# Patient Record
Sex: Female | Born: 1965 | Race: White | Hispanic: No | Marital: Married | Smoking: Never smoker
Health system: Southern US, Community
[De-identification: ages and names within clinical notes are randomized; demographics above are authoritative.]

## PROBLEM LIST (undated history)

## (undated) DIAGNOSIS — R42 Dizziness and giddiness: Secondary | ICD-10-CM

## (undated) DIAGNOSIS — Z9889 Other specified postprocedural states: Secondary | ICD-10-CM

## (undated) DIAGNOSIS — I8393 Asymptomatic varicose veins of bilateral lower extremities: Secondary | ICD-10-CM

## (undated) DIAGNOSIS — K5900 Constipation, unspecified: Secondary | ICD-10-CM

## (undated) DIAGNOSIS — R112 Nausea with vomiting, unspecified: Secondary | ICD-10-CM

## (undated) HISTORY — PX: VARICOSE VEIN SURGERY: SHX832

## (undated) HISTORY — PX: WISDOM TOOTH EXTRACTION: SHX21

## (undated) HISTORY — PX: COLONOSCOPY: SHX174

---

## 2009-10-22 ENCOUNTER — Ambulatory Visit: Payer: Self-pay | Admitting: Diagnostic Radiology

## 2009-10-22 ENCOUNTER — Emergency Department (HOSPITAL_BASED_OUTPATIENT_CLINIC_OR_DEPARTMENT_OTHER): Admission: EM | Admit: 2009-10-22 | Discharge: 2009-10-22 | Payer: Self-pay | Admitting: Emergency Medicine

## 2010-04-05 LAB — COMPREHENSIVE METABOLIC PANEL
BUN: 9 mg/dL (ref 6–23)
Calcium: 9.2 mg/dL (ref 8.4–10.5)
Glucose, Bld: 87 mg/dL (ref 70–99)
Total Protein: 6.8 g/dL (ref 6.0–8.3)

## 2010-04-05 LAB — CBC
HCT: 37 % (ref 36.0–46.0)
MCHC: 35.3 g/dL (ref 30.0–36.0)
MCV: 90.4 fL (ref 78.0–100.0)
RDW: 12.7 % (ref 11.5–15.5)

## 2010-04-05 LAB — URINALYSIS, ROUTINE W REFLEX MICROSCOPIC
Glucose, UA: NEGATIVE mg/dL
Hgb urine dipstick: NEGATIVE
Ketones, ur: NEGATIVE mg/dL
Protein, ur: NEGATIVE mg/dL

## 2010-04-05 LAB — DIFFERENTIAL
Basophils Relative: 1 % (ref 0–1)
Lymphs Abs: 1.2 10*3/uL (ref 0.7–4.0)
Monocytes Relative: 9 % (ref 3–12)
Neutro Abs: 4.2 10*3/uL (ref 1.7–7.7)
Neutrophils Relative %: 68 % (ref 43–77)

## 2010-04-05 LAB — GC/CHLAMYDIA PROBE AMP, GENITAL
Chlamydia, DNA Probe: NEGATIVE
GC Probe Amp, Genital: NEGATIVE

## 2010-04-05 LAB — WET PREP, GENITAL
Clue Cells Wet Prep HPF POC: NONE SEEN
Trich, Wet Prep: NONE SEEN

## 2014-11-08 DIAGNOSIS — M545 Other chronic pain: Secondary | ICD-10-CM | POA: Insufficient documentation

## 2014-11-08 DIAGNOSIS — G8929 Other chronic pain: Secondary | ICD-10-CM | POA: Insufficient documentation

## 2014-11-08 DIAGNOSIS — E042 Nontoxic multinodular goiter: Secondary | ICD-10-CM | POA: Insufficient documentation

## 2014-11-08 DIAGNOSIS — N959 Unspecified menopausal and perimenopausal disorder: Secondary | ICD-10-CM | POA: Insufficient documentation

## 2014-12-27 ENCOUNTER — Other Ambulatory Visit (HOSPITAL_COMMUNITY): Payer: Self-pay | Admitting: Surgery

## 2014-12-27 DIAGNOSIS — E042 Nontoxic multinodular goiter: Secondary | ICD-10-CM

## 2015-01-02 ENCOUNTER — Ambulatory Visit (HOSPITAL_COMMUNITY): Admission: RE | Admit: 2015-01-02 | Payer: BLUE CROSS/BLUE SHIELD | Source: Ambulatory Visit

## 2015-01-30 ENCOUNTER — Other Ambulatory Visit: Payer: Self-pay | Admitting: Radiology

## 2015-01-31 ENCOUNTER — Other Ambulatory Visit (HOSPITAL_COMMUNITY): Payer: Self-pay | Admitting: Surgery

## 2015-01-31 ENCOUNTER — Ambulatory Visit (HOSPITAL_COMMUNITY)
Admission: RE | Admit: 2015-01-31 | Discharge: 2015-01-31 | Disposition: A | Discharge status: Home / Self Care | Payer: BLUE CROSS/BLUE SHIELD | Source: Ambulatory Visit | Attending: Surgery | Admitting: Surgery

## 2015-01-31 DIAGNOSIS — E042 Nontoxic multinodular goiter: Secondary | ICD-10-CM

## 2015-01-31 MED ORDER — LIDOCAINE HCL (PF) 1 % IJ SOLN
INTRAMUSCULAR | Status: AC
  Filled 2015-01-31: qty 10

## 2015-01-31 NOTE — Procedures (Signed)
   US guided left thyroid nodule biopsy And US guided isthmus thyroid nodule biopsy  4  25g each  Pt tolerated well

## 2015-03-20 DIAGNOSIS — M5030 Other cervical disc degeneration, unspecified cervical region: Secondary | ICD-10-CM | POA: Insufficient documentation

## 2015-03-20 DIAGNOSIS — K6389 Other specified diseases of intestine: Secondary | ICD-10-CM | POA: Insufficient documentation

## 2015-04-18 DIAGNOSIS — G8929 Other chronic pain: Secondary | ICD-10-CM | POA: Insufficient documentation

## 2015-05-24 ENCOUNTER — Other Ambulatory Visit: Payer: Self-pay | Admitting: Surgery

## 2015-05-24 DIAGNOSIS — E042 Nontoxic multinodular goiter: Secondary | ICD-10-CM

## 2015-05-30 DIAGNOSIS — Z1231 Encounter for screening mammogram for malignant neoplasm of breast: Secondary | ICD-10-CM | POA: Diagnosis not present

## 2015-05-30 DIAGNOSIS — Z1239 Encounter for other screening for malignant neoplasm of breast: Secondary | ICD-10-CM | POA: Diagnosis not present

## 2015-07-10 ENCOUNTER — Ambulatory Visit
Admission: RE | Admit: 2015-07-10 | Discharge: 2015-07-10 | Disposition: A | Discharge status: Home / Self Care | Payer: BLUE CROSS/BLUE SHIELD | Source: Ambulatory Visit | Attending: Surgery | Admitting: Surgery

## 2015-07-10 DIAGNOSIS — E042 Nontoxic multinodular goiter: Secondary | ICD-10-CM | POA: Diagnosis not present

## 2015-07-31 DIAGNOSIS — L237 Allergic contact dermatitis due to plants, except food: Secondary | ICD-10-CM | POA: Diagnosis not present

## 2015-07-31 DIAGNOSIS — Z6821 Body mass index (BMI) 21.0-21.9, adult: Secondary | ICD-10-CM | POA: Diagnosis not present

## 2015-09-28 DIAGNOSIS — I83813 Varicose veins of bilateral lower extremities with pain: Secondary | ICD-10-CM | POA: Diagnosis not present

## 2015-09-28 DIAGNOSIS — I83893 Varicose veins of bilateral lower extremities with other complications: Secondary | ICD-10-CM | POA: Diagnosis not present

## 2015-09-29 DIAGNOSIS — M79605 Pain in left leg: Secondary | ICD-10-CM | POA: Diagnosis not present

## 2015-09-29 DIAGNOSIS — I83893 Varicose veins of bilateral lower extremities with other complications: Secondary | ICD-10-CM | POA: Diagnosis not present

## 2015-09-29 DIAGNOSIS — M79604 Pain in right leg: Secondary | ICD-10-CM | POA: Diagnosis not present

## 2015-10-03 DIAGNOSIS — M79642 Pain in left hand: Secondary | ICD-10-CM | POA: Diagnosis not present

## 2015-10-03 DIAGNOSIS — G5603 Carpal tunnel syndrome, bilateral upper limbs: Secondary | ICD-10-CM | POA: Diagnosis not present

## 2015-10-03 DIAGNOSIS — M25342 Other instability, left hand: Secondary | ICD-10-CM | POA: Diagnosis not present

## 2015-10-03 DIAGNOSIS — M79641 Pain in right hand: Secondary | ICD-10-CM | POA: Diagnosis not present

## 2015-10-04 DIAGNOSIS — M25342 Other instability, left hand: Secondary | ICD-10-CM | POA: Insufficient documentation

## 2015-10-04 DIAGNOSIS — G5603 Carpal tunnel syndrome, bilateral upper limbs: Secondary | ICD-10-CM | POA: Insufficient documentation

## 2015-10-09 DIAGNOSIS — M79605 Pain in left leg: Secondary | ICD-10-CM | POA: Diagnosis not present

## 2015-10-09 DIAGNOSIS — I83893 Varicose veins of bilateral lower extremities with other complications: Secondary | ICD-10-CM | POA: Diagnosis not present

## 2015-10-09 DIAGNOSIS — M79604 Pain in right leg: Secondary | ICD-10-CM | POA: Diagnosis not present

## 2015-12-18 DIAGNOSIS — I83819 Varicose veins of unspecified lower extremities with pain: Secondary | ICD-10-CM | POA: Diagnosis not present

## 2016-03-26 DIAGNOSIS — Z1151 Encounter for screening for human papillomavirus (HPV): Secondary | ICD-10-CM | POA: Diagnosis not present

## 2016-03-26 DIAGNOSIS — Z0000 Encounter for general adult medical examination without abnormal findings: Secondary | ICD-10-CM | POA: Diagnosis not present

## 2016-03-26 DIAGNOSIS — E049 Nontoxic goiter, unspecified: Secondary | ICD-10-CM | POA: Diagnosis not present

## 2016-06-13 ENCOUNTER — Telehealth: Payer: Self-pay | Admitting: General Practice

## 2016-06-13 NOTE — Telephone Encounter (Signed)
Patient called in stating she did not receive her new patient paperwork and was requesting it be re sent.

## 2016-06-13 NOTE — Telephone Encounter (Signed)
Paperwork mailed

## 2016-06-20 ENCOUNTER — Encounter: Payer: Self-pay | Admitting: Internal Medicine

## 2016-06-20 ENCOUNTER — Ambulatory Visit (INDEPENDENT_AMBULATORY_CARE_PROVIDER_SITE_OTHER): Payer: BLUE CROSS/BLUE SHIELD | Admitting: Internal Medicine

## 2016-06-20 VITALS — BP 110/60 | HR 71 | Ht 66.0 in | Wt 131.0 lb

## 2016-06-20 DIAGNOSIS — E042 Nontoxic multinodular goiter: Secondary | ICD-10-CM | POA: Diagnosis not present

## 2016-06-20 NOTE — Progress Notes (Signed)
Patient ID: Jeanne Roberts, female   DOB: 01-30-1965, 51 y.o.   MRN: [id number]   HPI  Jeanne Roberts is a 51 y.o.-year-old female, referred by Dr Harlow Asa for multiple thyroid nodules. She previously saw several endocrinologists, last: Dr. Meredith Pel. I reviewed her notes.  PCP: Dr Theodis Sato  She was dx'ed with thyroid nodules in 2005 - after a nodule was found by palpation.  Thyroid nodule Bx (05/24/2003):  - Left: benign - Right: benign  Thyroid U/S (11/17/2014): Right thyroid lobe: 64 x 30 x 31 mm. 46 x 27 x 37 mm complex mostly solid mass, inferior pole.  Left thyroid lobe: 65 x 38 x 39 mm. Somewhat lobular 53 x 26 x 42 mm solid hyperemic mass, inferior pole.  Isthmus Thickness: 6 mm.24 x 17 x 20 mm complex cyst, right isthmus  Lymphadenopathy: None visualized.  Bilateral calcified carotid plaque is incidentally noted, without assessment for degree of stenosis.  IMPRESSION: 1. Thyromegaly with right, left, and isthmic nodules which all meet consensus criteria for biopsy.  Thyroid nodule Bx (01/31/2015): - isthmus nodule: scant follicular epithelium: bethesda categ 1 - L thyroid nodule: benign  Thyroid U/S (07/10/2015): Right thyroid lobe: 7.2 x 3.2 x 3.6 cm. Heterogeneous lower pole nodule measured 2.8 x 4.9 x 2.5 cm and previously measured 4.6 x 2.7 x 3.7 cm. Subcentimeter right upper pole nodules are noted.  Left thyroid lobe: 7.3 x 3.4 x 4.5 cm. Solid heterogeneous lower pole nodule measures 4.4 x 5.2 x 2.9 cm and previously measured up to 5.3 cm. Subcentimeter left upper pole nodules are noted.  Isthmus Thickness: 0.8 cm. Hypoechoic nodule measures 1.9 x 1.4 x 2.1 cm and previously measured up to 2.4 cm.  Lymphadenopathy: None visualized.  IMPRESSION: Bilateral and isthmic nodules are not significantly changed as described above. The largest is on the left and measures up to 5.2 cm.  Pt denies: - feeling nodules in neck, but she can easily see them in the mirror -  hoarseness - dysphagia - choking - SOB with lying down  I reviewed pt's thyroid tests:  03/26/2016: TSH 1.62, free T4 1.19, free T3 3.53  03/20/2015: TSH 0.84, free T4 0.83, free T3 2.9 No results found for: TSH, FREET4   Pt denies: - fatigue - tremors - palpitations - anxiety/depression - hyperdefecation/constipation - weight loss/weight gain - dry skin - hair loss  But she has: - hot flushes - nocturia  + FH of thyroid ds in mother, who had to have her thyroid removed (not cancer). also, her 2 sisters also have thyroid problems. One of them was have thyroid surgery in [place]. Per my understanding, she also had an indeterminant nodule. No FH of thyroid cancer. No h/o radiation tx to head or neck.  No seaweed or kelp. No recent contrast studies. No steroid use. No herbal supplements. No Biotin supplements or Hair, Skin and Nails vitamins.   ROS: Constitutional: + see HPI Eyes: no blurry vision, no xerophthalmia ENT: no sore throat, + see HPI Cardiovascular: no CP/SOB/palpitations/leg swelling Respiratory: no cough/SOB Gastrointestinal: no N/V/D/C Musculoskeletal: no muscle/joint aches Skin: no rashes, + easy bruising Neurological: no tremors/numbness/tingling/dizziness Psychiatric: no depression/anxiety  No past medical history on file. No past surgical history on file. Social History   Social History  . Marital status: Married    Spouse name: N/A  . Number of children: 0   Occupational History  . n/a   Social History Main Topics  . Smoking status: Never Smoker  . Smokeless tobacco:  Never Used  . Alcohol use occasional  . Drug use: no   Current Outpatient Prescriptions  Medication Sig Dispense Refill  . ibuprofen (ADVIL,MOTRIN) 200 MG tablet Take 200 mg by mouth every 6 (six) hours as needed for headache, mild pain or moderate pain.    Marland Kitchen OVER THE COUNTER MEDICATION phytoestrogen    . OVER THE COUNTER MEDICATION Bone nutrient    Also,  probiotic  No Known Allergies   Family History  Problem Relation Age of Onset  . Hypertension Mother   . Thyroid disease Mother   . Diabetes Father   . Thyroid disease Sister   . Hypertension Maternal Uncle   . Heart disease Maternal Uncle      PE: BP 110/60 (BP Location: Left Arm, Patient Position: Sitting)   Pulse 71   Ht 5\' 6"  (1.676 m)   Wt 131 lb (59.4 kg)   SpO2 98%   BMI 21.14 kg/m  Wt Readings from Last 3 Encounters:  06/20/16 131 lb (59.4 kg)   Constitutional: normal weight, fit, in NAD Eyes: PERRLA, EOMI, no exophthalmos ENT: moist mucous membranes, + large L thyromegaly, no cervical lymphadenopathy except L upper submandibular reg. Cardiovascular: RRR, No MRG Respiratory: CTA B Gastrointestinal: abdomen soft, NT, ND, BS+ Musculoskeletal: no deformities, strength intact in all 4;  Skin: moist, warm, no rashes Neurological: no tremor with outstretched hands, DTR normal in all 4  ASSESSMENT: 1. Nontoxic multinodular goiter  PLAN: 1.Nontoxic multinodular goiter - Patient has a long history of a benign, nontoxic, large goiter, with 3 dominant nodules, one in each lobe and one in the isthmus. The lobar nodules appear hyperechoic and with increased vascularity; the left nodule has increased in size in the last few years however, this has been biopsied twice and both biopsies were benign. The right nodule was only biopsied 01/27/2003, however This appears smaller on the most recent ultrasound, so not worrisome. She also has an isthmic nodule is hypoechoic and contains an increased amount of fluid. This was drained in 01/2015, and at that time the biopsy result was inconclusive due to insufficient sample. - Patient is aware of the nodules, but is not bothered by them -  she has no neck compression symptoms. She also does not have a thyroid cancer family history or a personal history of RxTx to head/neck. All these would favor benignity.  - At this visit, we discussed about  further plan. We reviewed together the images of her latest thyroid ultrasound and I reviewed the characteristics of the nodules. The left and right nodules are not worrisome, although they are large, however, the isthmic nodule appears hypoechoic and containing mostly solid material. We did discuss that we most likely need to repeat the biopsy of this nodule, however, we decided to wait until next year to obtain another thyroid ultrasound to follow on the other nodules also.  * If the left and right nodules appear to have increased in size, it is probably best to undergo thyroidectomy.  * If the nodules are stable in size, will proceed with a biopsy of the isthmic nodule and decide for or against thyroidectomy depending on the results. We will probably need to perform Afirma molecular marker test at that time.  - I advised the patient to call me approximately 1 month prior to her next appointment so I can order her next thyroid ultrasound so she and I can review the images when she comes back. She agrees with the plan.   -  we reviewed together her recent TFTs and they were normal   [name], MD PhD [facility]

## 2016-06-20 NOTE — Patient Instructions (Signed)
Please call me 1 mo before the next appt to order another ultrasound.  Please return in 1 year.

## 2016-10-18 ENCOUNTER — Other Ambulatory Visit: Payer: Self-pay | Admitting: Internal Medicine

## 2016-10-18 ENCOUNTER — Telehealth: Payer: Self-pay | Admitting: Internal Medicine

## 2016-10-18 DIAGNOSIS — E042 Nontoxic multinodular goiter: Secondary | ICD-10-CM

## 2016-10-18 NOTE — Telephone Encounter (Signed)
Done

## 2016-10-18 NOTE — Telephone Encounter (Signed)
Ordered the U/S.

## 2016-10-18 NOTE — Telephone Encounter (Signed)
[facility] called in reference to [place] needing to be changed to ultrasound. thyroid. IMG [id number].

## 2016-10-18 NOTE — Telephone Encounter (Signed)
Called patient to advise that [place] was ordered.

## 2016-10-18 NOTE — Telephone Encounter (Signed)
Please advise. Thank you

## 2016-10-18 NOTE — Telephone Encounter (Signed)
Routing to you °

## 2016-10-18 NOTE — Telephone Encounter (Signed)
Patient would like to get a referral/order for breathing problems with the goiter and would like an ultrasound. Patient states that the goiter has gotten bigger. Call patient to advise as soon as possible.

## 2016-11-08 ENCOUNTER — Ambulatory Visit
Admission: RE | Admit: 2016-11-08 | Discharge: 2016-11-08 | Disposition: A | Discharge status: Home / Self Care | Payer: BLUE CROSS/BLUE SHIELD | Source: Ambulatory Visit | Attending: Internal Medicine | Admitting: Internal Medicine

## 2016-11-08 DIAGNOSIS — E042 Nontoxic multinodular goiter: Secondary | ICD-10-CM

## 2016-11-13 ENCOUNTER — Telehealth: Payer: Self-pay | Admitting: Internal Medicine

## 2016-11-13 ENCOUNTER — Telehealth: Payer: Self-pay

## 2016-11-13 ENCOUNTER — Other Ambulatory Visit: Payer: Self-pay | Admitting: Internal Medicine

## 2016-11-13 DIAGNOSIS — E042 Nontoxic multinodular goiter: Secondary | ICD-10-CM

## 2016-11-13 NOTE — Telephone Encounter (Signed)
Called and LVM advising patient to call back regarding her [place] results. Left call back number.

## 2016-11-13 NOTE — Telephone Encounter (Signed)
Patient returned call. Please call patient

## 2016-11-13 NOTE — Telephone Encounter (Signed)
Noted, thanks!

## 2016-11-13 NOTE — Telephone Encounter (Signed)
Patient called back, states that you called her last night regarding her ultrasound.  Thank you!

## 2016-11-13 NOTE — Telephone Encounter (Signed)
Yes, I tried again today. Will call ack when I finish with pts this am

## 2016-11-13 NOTE — Telephone Encounter (Signed)
Called and spoke to patient.

## 2016-11-13 NOTE — Telephone Encounter (Signed)
-----   Message from [name], MD sent at 11/13/2016 12:19 PM EDT ----- Almyra Free, I tried to call her 3x so far and cannot get in touch with her:  Can you please let her know that her left thyroid nodule is slightly larger, with the largest dimension increasing from 5.2 cm to 5.7 cm. This nodule was biopsied twice, including last year, and is benign. However, per my understanding, she has more neck compression symptoms and I am curious whether she is interested in thyroidectomy.  The other 2 nodules are slightly smaller, which is very good news: The isthmic nodule has been previously biopsied but the biopsy was inconclusive. If she does not desire thyroidectomy, we will need to rebiopsy this nodule and most likely also the right thyroid nodule, since this is still large, although, again, decreased in size slightly since last check.

## 2016-11-13 NOTE — Telephone Encounter (Signed)
Patient received message from Dr. Renne Crigler but the message was cut off . Please call patient on cell# [phone]

## 2016-11-14 ENCOUNTER — Telehealth: Payer: Self-pay

## 2016-11-14 ENCOUNTER — Telehealth: Payer: Self-pay | Admitting: Internal Medicine

## 2016-11-14 NOTE — Telephone Encounter (Signed)
Patient needs to talk with you before she has her biopsy. Please call her she has questions

## 2016-11-14 NOTE — Telephone Encounter (Signed)
error 

## 2016-11-14 NOTE — Telephone Encounter (Signed)
Pt would like to know if it is an urgency in getting the biopsy? She would like to wait to January to start over with her deductible if that is ok since it has shrunken. Please advise.

## 2016-11-14 NOTE — Telephone Encounter (Signed)
Called pt, LVM for her to call back to discuss her questions and concerns.

## 2016-11-14 NOTE — Telephone Encounter (Signed)
Pt has been informed and expressed understanding.  

## 2016-11-14 NOTE — Telephone Encounter (Signed)
Yes, no pb >> please tell her to let [place] know when they call.

## 2016-11-14 NOTE — Telephone Encounter (Signed)
Patient returning nurses call is requesting a call back on her cell [phone]

## 2016-11-15 DIAGNOSIS — Z1231 Encounter for screening mammogram for malignant neoplasm of breast: Secondary | ICD-10-CM | POA: Diagnosis not present

## 2017-04-09 DIAGNOSIS — L237 Allergic contact dermatitis due to plants, except food: Secondary | ICD-10-CM | POA: Diagnosis not present

## 2017-05-20 ENCOUNTER — Encounter: Payer: Self-pay | Admitting: Internal Medicine

## 2017-05-20 ENCOUNTER — Ambulatory Visit (INDEPENDENT_AMBULATORY_CARE_PROVIDER_SITE_OTHER): Payer: BLUE CROSS/BLUE SHIELD | Admitting: Internal Medicine

## 2017-05-20 VITALS — BP 124/74 | HR 75 | Ht 66.0 in | Wt 136.8 lb

## 2017-05-20 DIAGNOSIS — L659 Nonscarring hair loss, unspecified: Secondary | ICD-10-CM

## 2017-05-20 DIAGNOSIS — E042 Nontoxic multinodular goiter: Secondary | ICD-10-CM

## 2017-05-20 LAB — T3, FREE: T3 FREE: 2.9 pg/mL (ref 2.3–4.2)

## 2017-05-20 LAB — TSH: TSH: 0.85 u[IU]/mL (ref 0.35–4.50)

## 2017-05-20 LAB — T4, FREE: Free T4: 0.8 ng/dL (ref 0.60–1.60)

## 2017-05-20 NOTE — Patient Instructions (Signed)
Please stop at the lab.  Please come back for a follow-up appointment in 1 year.  

## 2017-05-20 NOTE — Progress Notes (Signed)
Patient ID: Jeanne Roberts, female   DOB: 1965-11-04, 52 y.o.   MRN: [id number]   HPI  Jeanne Roberts is a 52 y.o.-year-old female, referred by Dr Harlow Asa for multiple thyroid nodules. She previously saw several endocrinologists, last: Dr. Meredith Pel.  Last visit with me almost a year ago. PCP: Dr Theodis Sato  Reviewed and addended history:  She was dx'ed with thyroid nodules in 2005, after a thyroid nodule was found by palpation.  Thyroid nodule Bx (05/24/2003):  - Left: benign - Right: benign  Thyroid U/S (11/17/2014): Right thyroid lobe: 64 x 30 x 31 mm. 46 x 27 x 37 mm complex mostly solid mass, inferior pole.  Left thyroid lobe: 65 x 38 x 39 mm. Somewhat lobular 53 x 26 x 42 mm solid hyperemic mass, inferior pole.  Isthmus Thickness: 6 mm.24 x 17 x 20 mm complex cyst, right isthmus  Lymphadenopathy: None visualized.  Bilateral calcified carotid plaque is incidentally noted, without assessment for degree of stenosis.  IMPRESSION: 1. Thyromegaly with right, left, and isthmic nodules which all meet consensus criteria for biopsy.  Thyroid nodule Bx (01/31/2015): - isthmus nodule: scant follicular epithelium: bethesda categ 1 - L thyroid nodule: benign  Thyroid U/S (07/10/2015): Right thyroid lobe: 7.2 x 3.2 x 3.6 cm. Heterogeneous lower pole nodule measured 2.8 x 4.9 x 2.5 cm and previously measured 4.6 x 2.7 x 3.7 cm. Subcentimeter right upper pole nodules are noted.  Left thyroid lobe: 7.3 x 3.4 x 4.5 cm. Solid heterogeneous lower pole nodule measures 4.4 x 5.2 x 2.9 cm and previously measured up to 5.3 cm. Subcentimeter left upper pole nodules are noted.  Isthmus Thickness: 0.8 cm. Hypoechoic nodule measures 1.9 x 1.4 x 2.1 cm and previously measured up to 2.4 cm.  Lymphadenopathy: None visualized.  IMPRESSION: Bilateral and isthmic nodules are not significantly changed as described above. The largest is on the left and measures up to 5.2 cm.  Thyroid U/S  (11/08/2016):  Parenchymal Echotexture: Moderately heterogenous  Right lobe: 7 x 2.7 x 3.3 cm, previously 7.2 x 3.2 x 3.6.  Right inferior nodule: 4.2 x 2 x 3.1 cm, previously 4.9 x 2.5 x 2.8 cm, mixed cystic and solid, hypoechoic.  Not previously biopsied   Left lobe: 7 x 3.1 x 4 cm, previously 7.3 x 3.4 x 4.5.  Lobular mid left nodule: 5.7 x 2.5 x 4.8 cm, previously 5.2 x 2.9 x 4.4; this was previously biopsied.   Isthmus: 0.9 cm thickness, previously 0.8 cm.  Very hypoechoic isthmic nodule: 1.5 x 0.8 x 1 cm, previously 2.1 x1.4 x 1.9; this was previously biopsied but with scant material.  IMPRESSION: 1. Thyromegaly with little change in bilateral and isthmic nodules. 2. Recommend FNA biopsy of mildly suspicious 4.2 cm inferior right nodule.  Plan was:  Notes recorded by [name], MD on 11/13/2016 at 12:19 PM EDT .Marland KitchenMarland KitchenHer left thyroid nodule is slightly larger, with the largest dimension increasing from 5.2 cm to 5.7 cm. This nodule was biopsied twice, including last year, and is benign. However, per my understanding, she has more neck compression symptoms and I am curious whether she is interested in thyroidectomy.  The other 2 nodules are slightly smaller, which is very good news: The isthmic nodule has been previously biopsied but the biopsy was inconclusive. If she does not desire thyroidectomy, we will need to rebiopsy this nodule and most likely also the right thyroid nodule, since this is still large, although, again, decreased in size slightly since last check.  Notes recorded by Caprice Beaver, LPN on 40/98/1191 at 12:52 PM EDT Called patient and advised of note, she stated that is is breathing better now she believes it could have been coming from anxiety, but she would like to re biopsy, instead of doing the thyroidectomy. And if she needs to have it done, she would like to have it done before the end of the year, I advised we may not need them that close together.   I  ordered the biopsy of the left thyroid nodule and the isthmic nodule after the results above returned, but she did not have this done yet.   Pt denies: - feeling nodules in neck - hoarseness - dysphagia - choking - SOB with lying down, but has snoring  Reviewed patient's most recent TFTs: 03/26/2016: TSH 1.62, free T4 1.19, free T3 3.53  03/20/2015: TSH 0.84, free T4 0.83, free T3 2.9 No results found for: TSH, FREET4   Pt denies: Fatigue Tremors Palpitations Anxiety/depression Hyper defecation/constipation Weight loss/weight gain  She continues to have: + Hot flashes -chronic + Nocturia -chronic + hair loss - tried Biotin, now on collagen  + FH of thyroid ds in mother, who had to have her thyroid removed (not cancer). also, her 2 sisters also have thyroid problems. One of them was have thyroid surgery in [place]. She also had an indeterminant nodule. She actually ended up having thyroid CA in another, smaller, nodule.  No FH of thyroid cancer. No h/o radiation tx to head or neck.  No seaweed or kelp. No recent contrast studies. No herbal supplements. No Biotin use. No recent steroids use.   ROS: Constitutional: + See HPI Eyes: no blurry vision, no xerophthalmia ENT: no sore throat, + see HPI Cardiovascular: no CP/no SOB/no palpitations/no leg swelling Respiratory: no cough/no SOB/no wheezing Gastrointestinal: no N/no V/no D/no C/no acid reflux Musculoskeletal: no muscle aches/no joint aches Skin: no rashes, + hair loss Neurological: no tremors/no numbness/no tingling/no dizziness  I reviewed pt's medications, allergies, PMH, social hx, family hx, and changes were documented in the history of present illness. Otherwise, unchanged from my initial visit note.  PMH: Patient Active Problem List   Diagnosis Date Noted  . Nontoxic multinodular goiter 06/20/2016    Priority: High   No past surgical history on file.   Social History   Social History  . Marital  status: Married    Spouse name: N/A  . Number of children: 0   Occupational History  . n/a   Social History Main Topics  . Smoking status: Never Smoker  . Smokeless tobacco: Never Used  . Alcohol use occasional  . Drug use: no   Current Outpatient Medications  Medication Sig Dispense Refill  . ibuprofen (ADVIL,MOTRIN) 200 MG tablet Take 200 mg by mouth every 6 (six) hours as needed for headache, mild pain or moderate pain.    Marland Kitchen OVER THE COUNTER MEDICATION     . OVER THE COUNTER MEDICATION      No current facility-administered medications for this visit.    No Known Allergies   Family History  Problem Relation Age of Onset  . Hypertension Mother   . Thyroid disease Mother   . Diabetes Father   . Thyroid disease Sister   . Hypertension Maternal Uncle   . Heart disease Maternal Uncle      PE: BP 124/74   Pulse 75   Ht 5\' 6"  (1.676 m)   Wt 136 lb 12.8 oz (62.1 kg)  SpO2 99%   BMI 22.08 kg/m  Wt Readings from Last 3 Encounters:  05/20/17 136 lb 12.8 oz (62.1 kg)  06/20/16 131 lb (59.4 kg)   Constitutional: normal weight, in NAD Eyes: PERRLA, EOMI, no exophthalmos ENT: moist mucous membranes, + large L thyromegaly, no cervical lymphadenopathy Cardiovascular: RRR, No MRG Respiratory: CTA B Gastrointestinal: abdomen soft, NT, ND, BS+ Musculoskeletal: no deformities, strength intact in all 4 Skin: moist, warm, no rashes Neurological: no tremor with outstretched hands, DTR normal in all 4  ASSESSMENT: 1. Nontoxic multinodular goiter  2. Hair loss  PLAN: 1.Nontoxic multinodular goiter - Patient has a history of a benign, nontoxic, large Goiter, with 3 dominant nodules, one in each lobe and one in the isthmus.  The nodules in the 2 lobes appear hyperechoic and with increased vascularity.  The left nodule has been biopsied twice with benign results.  The right nodule was only biopsied in 2005, however, this nodule appears smaller on the latest ultrasound from  10/2016.  She also has an isthmic nodule which is hypoechoic and contains fluid.  This was drained in 01/2015, and at that time the biopsy result was inconclusive due to insufficient sample. - She called [place] at the end of last year with symptoms, but in the end, she actually attributed them to anxiety. She does have snoring and does feel the nodule when she turns her head, but otherwise no neck compression symptoms.  We discussed about the possibility about obtaining a neck CT to check the degree of tracheal compression or displacement, but she has a high deductible insurance plan and would like to avoid this for now.  We again reviewed the fact that the ultrasound characteristics of the nodules.  The left and the right nodules do not appear worrisome, however, they are large.  This may nodule appears hypoechoic containing mostly solid material.  We discussed about re-biopsying this.  We will also plan to rebiopsy the right thyroid nodule, although this is slightly smaller on the last ultrasound.  With 2 benign biopsies, I am not worried about the left thyroid nodule, although if this  continues to increase, she will probably need to have thyroidectomy.  She agrees with the biopsies. - She tells me today that she has a family history of thyroid cancer in her sister, which is a new diagnosis but no personal history of radiation therapy to head or neck.   - Reviewed her most recent TFTs from last year and these were normal. - I will see her back in a year.  She is aware  that she needs to contact me if she develops any neck compression symptoms.  2. Hair loss - We will check her TFTs today - She has an appointment with PCP in June for further investigation in case the above is negative - Was on biotin, but this did not help and now is on collagen.  Office Visit on 05/20/2017  Component Date Value Ref Range Status  . TSH 05/20/2017 0.85  0.35 - 4.50 uIU/mL Final  . Free T4 05/20/2017 0.80  0.60 - 1.60  ng/dL Final   Comment: Specimens from patients who are undergoing biotin therapy and /or ingesting biotin supplements may contain high levels of biotin.  The higher biotin concentration in these specimens interferes with this Free T4 assay.  Specimens that contain high levels  of biotin may cause false high results for this Free T4 assay.  Please interpret results in light of the total clinical presentation  of the patient.    . T3, Free 05/20/2017 2.9  2.3 - 4.2 pg/mL Final   TFTs normal.  [name], MD PhD [facility]

## 2017-05-21 ENCOUNTER — Telehealth: Payer: Self-pay

## 2017-05-21 NOTE — Telephone Encounter (Signed)
Spoke to patient. Gave lab results. Patient verbalized understanding.  

## 2017-05-21 NOTE — Telephone Encounter (Signed)
-----   Message from [name], MD sent at 05/20/2017  5:00 PM EDT ----- Jeanne Roberts, can you please call pt: TFTs = perfect!

## 2017-05-29 DIAGNOSIS — N390 Urinary tract infection, site not specified: Secondary | ICD-10-CM | POA: Diagnosis not present

## 2017-05-29 DIAGNOSIS — R350 Frequency of micturition: Secondary | ICD-10-CM | POA: Diagnosis not present

## 2017-05-29 DIAGNOSIS — R319 Hematuria, unspecified: Secondary | ICD-10-CM | POA: Diagnosis not present

## 2017-05-29 DIAGNOSIS — R300 Dysuria: Secondary | ICD-10-CM | POA: Diagnosis not present

## 2017-06-06 DIAGNOSIS — N898 Other specified noninflammatory disorders of vagina: Secondary | ICD-10-CM | POA: Diagnosis not present

## 2017-06-06 DIAGNOSIS — N952 Postmenopausal atrophic vaginitis: Secondary | ICD-10-CM | POA: Diagnosis not present

## 2017-06-20 ENCOUNTER — Ambulatory Visit: Payer: BLUE CROSS/BLUE SHIELD | Admitting: Internal Medicine

## 2017-07-15 ENCOUNTER — Inpatient Hospital Stay
Admission: RE | Admit: 2017-07-15 | Discharge: 2017-07-15 | Disposition: A | Discharge status: Home / Self Care | Payer: BLUE CROSS/BLUE SHIELD | Source: Ambulatory Visit | Attending: Internal Medicine | Admitting: Internal Medicine

## 2017-07-15 ENCOUNTER — Inpatient Hospital Stay: Admission: RE | Admit: 2017-07-15 | Payer: BLUE CROSS/BLUE SHIELD | Source: Ambulatory Visit

## 2017-08-15 ENCOUNTER — Telehealth: Payer: Self-pay

## 2017-08-15 NOTE — Telephone Encounter (Signed)
[facility] [phone] called to verify if you want afirma with both biopsies. Please advise

## 2017-08-15 NOTE — Telephone Encounter (Signed)
Jeanne Roberts at [facility] is aware

## 2017-08-15 NOTE — Telephone Encounter (Signed)
Yes, the AFIRMA should be run on any nodule if the results of the biopsies are inconclusive. Of course, it should not be run if the results are benign or malignant.

## 2017-08-21 ENCOUNTER — Ambulatory Visit
Admission: RE | Admit: 2017-08-21 | Discharge: 2017-08-21 | Disposition: A | Discharge status: Home / Self Care | Payer: BLUE CROSS/BLUE SHIELD | Source: Ambulatory Visit | Attending: Internal Medicine | Admitting: Internal Medicine

## 2017-08-21 ENCOUNTER — Other Ambulatory Visit (HOSPITAL_COMMUNITY)
Admission: RE | Admit: 2017-08-21 | Discharge: 2017-08-21 | Disposition: A | Discharge status: Home / Self Care | Payer: BLUE CROSS/BLUE SHIELD | Source: Ambulatory Visit | Attending: Radiology | Admitting: Radiology

## 2017-08-21 DIAGNOSIS — E079 Disorder of thyroid, unspecified: Secondary | ICD-10-CM | POA: Diagnosis not present

## 2017-08-21 DIAGNOSIS — E042 Nontoxic multinodular goiter: Secondary | ICD-10-CM | POA: Diagnosis not present

## 2017-08-21 DIAGNOSIS — E041 Nontoxic single thyroid nodule: Secondary | ICD-10-CM | POA: Diagnosis not present

## 2017-09-05 ENCOUNTER — Telehealth: Payer: Self-pay | Admitting: Internal Medicine

## 2017-09-05 NOTE — Telephone Encounter (Signed)
Patient would like to know if her biopsy results have come back.   Please advise

## 2017-09-08 NOTE — Telephone Encounter (Signed)
Pt is aware.  

## 2017-09-08 NOTE — Telephone Encounter (Signed)
Please advise on below  

## 2017-09-08 NOTE — Telephone Encounter (Signed)
I have not received the results of her [facility] molecular test yet.  We will keep an eye open for it and I will let her know as soon as I  receive it.  This does usually take between 1 to 3 weeks.

## 2017-09-15 ENCOUNTER — Telehealth: Payer: Self-pay | Admitting: Internal Medicine

## 2017-09-15 NOTE — Telephone Encounter (Signed)
Patient stated she has been dealing with vertigo on and off for about 6 months. She now has a knot that has come up on her left side of her neck and she would like to know if this could be from the vertigo. She would like a call back to further discuss this  Please advise

## 2017-09-15 NOTE — Telephone Encounter (Signed)
Please advise on below  

## 2017-09-15 NOTE — Telephone Encounter (Signed)
Pt is aware.  

## 2017-09-15 NOTE — Telephone Encounter (Signed)
Please ask her to check with her PCP.  There should not be a connection between her vertigo and the knot.

## 2017-09-19 ENCOUNTER — Telehealth: Payer: Self-pay

## 2017-09-19 NOTE — Telephone Encounter (Signed)
C, I did not receive this yet.  Can you please call the cytopathology department and see if they have seen the [facility] results or if they can help US obtaining this?

## 2017-09-19 NOTE — Telephone Encounter (Signed)
Pt would like to know if her biopsy results have came in yet since its been a month and she states she was told it would be 3 weeks

## 2017-09-19 NOTE — Telephone Encounter (Signed)
Per Cytology: it was two samples and they did accidentally miss sending it out because only one of the two was being sent. They stated that they are very sorry it is being over-nighted and will arrive Saturday morning. It will still be 2 weeks for results. Again they voiced how sorry they are and stated that this is all their fault. I told her I would call patient.   Spoke with pt and she is aware. She is upset but understands and will wait for results.

## 2017-09-19 NOTE — Telephone Encounter (Signed)
Oh.  I see. Thank you for talking to her.

## 2017-09-19 NOTE — Telephone Encounter (Signed)
Spoke to Cytology and they can not find the date it was sent or any of the needed information, she stated that she would investigate and call me back

## 2017-09-20 DIAGNOSIS — E042 Nontoxic multinodular goiter: Secondary | ICD-10-CM | POA: Diagnosis not present

## 2017-09-29 DIAGNOSIS — D440 Neoplasm of uncertain behavior of thyroid gland: Secondary | ICD-10-CM | POA: Diagnosis not present

## 2017-09-29 DIAGNOSIS — E042 Nontoxic multinodular goiter: Secondary | ICD-10-CM | POA: Diagnosis not present

## 2017-10-01 ENCOUNTER — Encounter (HOSPITAL_COMMUNITY): Payer: Self-pay

## 2017-10-01 ENCOUNTER — Telehealth: Payer: Self-pay

## 2017-10-01 NOTE — Telephone Encounter (Signed)
-----   Message from [name], MD sent at 10/01/2017  8:47 AM EDT ----- C,  Can you please call pt >> Jeanne Roberts results (of her thyroid nodule) are finally back: suspicious.  For this type of result, the risk of malignancy is approximately 50%.  We usually do recommend total thyroidectomy for this as, I believe, Dr. Harlow Asa already discussed with her.  I will let Dr. Harlow Asa know to schedule her for another appointment to discuss about this.  Please tell her that I tried to call her about the result but I did not want to leave a message and I could not send her my chart message since she does not have it active. Ty, C

## 2017-10-01 NOTE — Telephone Encounter (Signed)
Pt is aware.  

## 2017-10-09 ENCOUNTER — Ambulatory Visit: Payer: Self-pay | Admitting: Surgery

## 2017-10-09 DIAGNOSIS — E042 Nontoxic multinodular goiter: Secondary | ICD-10-CM | POA: Diagnosis not present

## 2017-10-09 DIAGNOSIS — D440 Neoplasm of uncertain behavior of thyroid gland: Secondary | ICD-10-CM | POA: Diagnosis not present

## 2017-10-17 DIAGNOSIS — L509 Urticaria, unspecified: Secondary | ICD-10-CM | POA: Diagnosis not present

## 2017-10-17 DIAGNOSIS — L239 Allergic contact dermatitis, unspecified cause: Secondary | ICD-10-CM | POA: Diagnosis not present

## 2017-10-17 DIAGNOSIS — E042 Nontoxic multinodular goiter: Secondary | ICD-10-CM | POA: Diagnosis not present

## 2017-10-31 NOTE — Pre-Procedure Instructions (Signed)
Jeanne Roberts  10/31/2017      [facility], [place] [address] [place] [address] Phone: [phone] Fax: [place], [place] AT [facility] OF [place] [facility] [place] [address] Phone: [phone] Fax: [phone]    Your procedure is scheduled on November 10, 2017.  Report to [facility] Admitting at 530 AM.  Call this number if you have problems the morning of surgery:  [phone]   Remember:  Do not eat or drink after midnight.    Take these medicines the morning of surgery with A SIP OF WATER -none  7 days prior to surgery STOP taking any Aspirin (unless otherwise instructed by your surgeon), Aleve, Naproxen, Ibuprofen, Motrin, Advil, Goody's, BC's, all herbal medications, fish oil, and all vitamins    Do not wear jewelry, make-up or nail polish.  Do not wear lotions, powders, or perfumes, or deodorant.  Do not shave 48 hours prior to surgery.    Do not bring valuables to the hospital.   [facility] is not responsible for any belongings or valuables.  Contacts, dentures or bridgework may not be worn into surgery.  Leave your suitcase in the car.  After surgery it may be brought to your room.  For patients admitted to the hospital, discharge time will be determined by your treatment team.  Patients discharged the day of surgery will not be allowed to drive home.   [place]- Preparing For Surgery  Before surgery, you can play an important role. Because skin is not sterile, your skin needs to be as free of germs as possible. You can reduce the number of germs on your skin by washing with CHG (chlorahexidine gluconate) Soap before surgery.  CHG is an antiseptic cleaner which kills germs and bonds with the skin to continue killing germs even after washing.    Oral Hygiene is also important to reduce your risk  of infection.  Remember - BRUSH YOUR TEETH THE MORNING OF SURGERY WITH YOUR REGULAR TOOTHPASTE  Please do not use if you have an allergy to CHG or antibacterial soaps. If your skin becomes reddened/irritated stop using the CHG.  Do not shave (including legs and underarms) for at least 48 hours prior to first CHG shower. It is OK to shave your face.  Please follow these instructions carefully.   1. Shower the NIGHT BEFORE SURGERY and the MORNING OF SURGERY with CHG.   2. If you chose to wash your hair, wash your hair first as usual with your normal shampoo.  3. After you shampoo, rinse your hair and body thoroughly to remove the shampoo.  4. Use CHG as you would any other liquid soap. You can apply CHG directly to the skin and wash gently with a scrungie or a clean washcloth.   5. Apply the CHG Soap to your body ONLY FROM THE NECK DOWN.  Do not use on open wounds or open sores. Avoid contact with your eyes, ears, mouth and genitals (private parts). Wash Face and genitals (private parts)  with your normal soap.  6. Wash thoroughly, paying special attention to the area where your surgery will be performed.  7. Thoroughly rinse your body with warm water from the neck down.  8. DO NOT shower/wash with your normal soap after using and rinsing off the CHG Soap.  9. Pat yourself dry with a CLEAN  TOWEL.  10. Wear CLEAN PAJAMAS to bed the night before surgery, wear comfortable clothes the morning of surgery  11. Place CLEAN SHEETS on your bed the night of your first shower and DO NOT SLEEP WITH PETS.   Day of Surgery:  Do not apply any deodorants/lotions.  Please wear clean clothes to the hospital/surgery center.   Remember to brush your teeth WITH YOUR REGULAR TOOTHPASTE.  Please read over the fact sheets that you were given.

## 2017-11-03 ENCOUNTER — Encounter (HOSPITAL_COMMUNITY): Payer: Self-pay

## 2017-11-03 ENCOUNTER — Encounter (HOSPITAL_COMMUNITY)
Admission: RE | Admit: 2017-11-03 | Discharge: 2017-11-03 | Disposition: A | Discharge status: Home / Self Care | Payer: BLUE CROSS/BLUE SHIELD | Source: Ambulatory Visit | Attending: Surgery | Admitting: Surgery

## 2017-11-03 ENCOUNTER — Ambulatory Visit (HOSPITAL_COMMUNITY)
Admission: RE | Admit: 2017-11-03 | Discharge: 2017-11-03 | Disposition: A | Discharge status: Home / Self Care | Payer: BLUE CROSS/BLUE SHIELD | Source: Ambulatory Visit | Attending: Anesthesiology | Admitting: Anesthesiology

## 2017-11-03 ENCOUNTER — Other Ambulatory Visit: Payer: Self-pay

## 2017-11-03 DIAGNOSIS — Z01818 Encounter for other preprocedural examination: Secondary | ICD-10-CM | POA: Insufficient documentation

## 2017-11-03 DIAGNOSIS — R918 Other nonspecific abnormal finding of lung field: Secondary | ICD-10-CM | POA: Diagnosis not present

## 2017-11-03 DIAGNOSIS — E079 Disorder of thyroid, unspecified: Secondary | ICD-10-CM | POA: Insufficient documentation

## 2017-11-03 HISTORY — DX: Nausea with vomiting, unspecified: R11.2

## 2017-11-03 HISTORY — DX: Constipation, unspecified: K59.00

## 2017-11-03 HISTORY — DX: Other specified postprocedural states: Z98.890

## 2017-11-03 HISTORY — DX: Dizziness and giddiness: R42

## 2017-11-03 HISTORY — DX: Asymptomatic varicose veins of bilateral lower extremities: I83.93

## 2017-11-03 LAB — CBC
HCT: 43.2 % (ref 36.0–46.0)
HEMOGLOBIN: 13.6 g/dL (ref 12.0–15.0)
MCH: 29.5 pg (ref 26.0–34.0)
MCHC: 31.5 g/dL (ref 30.0–36.0)
MCV: 93.7 fL (ref 80.0–100.0)
Platelets: 238 10*3/uL (ref 150–400)
RBC: 4.61 MIL/uL (ref 3.87–5.11)
RDW: 13.2 % (ref 11.5–15.5)
WBC: 8 10*3/uL (ref 4.0–10.5)
nRBC: 0 % (ref 0.0–0.2)

## 2017-11-03 NOTE — Pre-Procedure Instructions (Signed)
Jeanne Roberts  11/03/2017    Your procedure is scheduled on Monday, November 10, 2017 at 7:30 AM.   Report to [facility] En[facility]" Admitting Office at 5:30 AM.   Call this number if you have problems the morning of surgery: [phone]   Questions prior to day of surgery, please call [phone] between 8 & 4 PM.   Remember:  Do not eat or drink after midnight Sunday, 11/09/17.   Stop Herbal medications and Vitamins as of today. Do not use NSAIDS (Aleve, Ibuprofen, etc) or Aspirin products prior to surgery.    Do not wear jewelry, make-up or nail polish.  Do not wear lotions, powders, perfumes or deodorant.  Do not shave 48 hours prior to surgery.    Do not bring valuables to the hospital.   [place] is not responsible for any belongings or valuables.  Contacts, dentures or bridgework may not be worn into surgery.  Leave your suitcase in the car.  After surgery it may be brought to your room.  For patients admitted to the hospital, discharge time will be determined by your treatment team.  Patients discharged the day of surgery will not be allowed to drive home.   [place] - Preparing for Surgery  Before surgery, you can play an important role.  Because skin is not sterile, your skin needs to be as free of germs as possible.  You can reduce the number of germs on you skin by washing with CHG (chlorahexidine gluconate) soap before surgery.  CHG is an antiseptic cleaner which kills germs and bonds with the skin to continue killing germs even after washing.  Oral Hygiene is also important in reducing the risk of infection.  Remember to brush your teeth with your regular toothpaste the morning of surgery.  Please DO NOT use if you have an allergy to CHG or antibacterial soaps.  If your skin becomes reddened/irritated stop using the CHG and inform your nurse when you arrive at Short Stay.  Do not shave (including legs and underarms) for at least 48 hours prior to  the first CHG shower.  You may shave your face.  Please follow these instructions carefully:   1.  Shower with CHG Soap the night before surgery and the morning of Surgery.  2.  If you choose to wash your hair, wash your hair first as usual with your normal shampoo.  3.  After you shampoo, rinse your hair and body thoroughly to remove the shampoo. 4.  Use CHG as you would any other liquid soap.  You can apply chg directly to the skin and wash gently with a      scrungie or washcloth.           5.  Apply the CHG Soap to your body ONLY FROM THE NECK DOWN.   Do not use on open wounds or open sores. Avoid contact with your eyes, ears, mouth and genitals (private parts).  Wash genitals (private parts) with your normal soap.  6.  Wash thoroughly, paying special attention to the area where your surgery will be performed.  7.  Thoroughly rinse your body with warm water from the neck down.  8.  DO NOT shower/wash with your normal soap after using and rinsing off the CHG Soap.  9.  Pat yourself dry with a clean towel.            10 .  Wear clean pajamas.  11.  Place clean sheets on your bed the night of your first shower and do not sleep with pets.  Day of Surgery  Shower as above. Do not apply any lotions/deodorants the morning of surgery.   Please wear clean clothes to the hospital. Remember to brush your teeth with toothpaste.    Please read over the fact sheets that you were given.

## 2017-11-03 NOTE — Progress Notes (Signed)
Pt denies cardiac history, chest pain, HTN or Diabetes.   Pt has requested Dr. Linna Caprice to be her Anesthesiologist and has certain CRNA's that she would like to have. List is noted in Special needs on the OR schedule.

## 2017-11-04 NOTE — Progress Notes (Signed)
Anesthesia Chart Review:  Case:  [id number] Date/Time:  11/10/17 0715   Procedure:  TOTAL THYROIDECTOMY (N/A )   Anesthesia type:  General   Pre-op diagnosis:  thyroid neoplasm of uncertian behavior , multiple thyroid nodules   Location:  [place] [facility] [place] OR   Surgeon:  Armandina Gemma, Jeanne Roberts     She requested Roberts Gaudy, Jeanne Roberts as anesthesiologist and Jeanne Roberts, Jeanne Roberts, or Jeanne Roberts as her CRNA.  DISCUSSION: Patient is a 52 year old female scheduled for the above procedure. She is s/p right thyroid nodule FNA with Afirma testing: suspicious, so thyroidectomy recommended.   History includes never smoker, post-operative N/V, vertigo, multinodular goiter. There is mild deviation of the trachea to the right at the thoracic inlet.  If no acute changes then I would anticipate that she can proceed as planned.   VS: BP 132/80   Pulse 79   Temp 36.7 C   Resp 20   Ht 5\' 6"  (1.676 m)   Wt 60.2 kg   SpO2 100%   BMI 21.42 kg/m   PROVIDERS: Jeanne Idol, Jeanne Roberts is listed as PCP Jeanne Kingdom, Jeanne Roberts is endocrinologist.    LABS: Labs reviewed: Acceptable for surgery. Thyroid studies WNL 05/20/17. (all labs ordered are listed, but only abnormal results are displayed)  Labs Reviewed  CBC    IMAGES: CXR 11/03/17: FINDINGS: The lungs are mildly hyperinflated and clear. The heart and pulmonary vascularity are normal. There is mild deviation of the trachea to the right at the thoracic inlet. There is no pleural effusion. The bony thorax exhibits no acute abnormality. IMPRESSION: Mild hyperinflation consistent with reactive airway disease or COPD. There is no acute cardiopulmonary abnormality.  Thyroid U/S 11/08/16: FINDINGS: Parenchymal Echotexture: Moderately heterogenous Isthmus: 0.9 cm thickness, previously 0.8 Right lobe: 7 x 2.7 x 3.3 cm, previously 7.2 x 3.2 x 3.6 Left lobe: 7 x 3.1 x 4 cm, previously 7.3 x 3.4 x 4.5   EKG: N/A   CV:  Carotid U/S 03/20/15  ([place]): Result Impression  1. Moderate to large amount of left-sided atherosclerotic plaque, not resulting in a hemodynamically significant stenosis. 2. Minimal amount of right-sided atherosclerotic plaque, not resulting in a hemodynamically significant stenosis.     Past Medical History:  Diagnosis Date  . Constipation   . PONV (postoperative nausea and vomiting)   . Varicose veins of both lower extremities    has had them stripped  . Vertigo     Past Surgical History:  Procedure Laterality Date  . COLONOSCOPY    . VARICOSE VEIN SURGERY Bilateral   . WISDOM TOOTH EXTRACTION      MEDICATIONS: . Calcium-Vitamin D-Vitamin K (VIACTIV PO)  . EVENING PRIMROSE OIL PO  . ibuprofen (ADVIL,MOTRIN) 200 MG tablet  . OVER THE COUNTER MEDICATION  . Probiotic Product (PROBIOTIC PO)   No current facility-administered medications for this encounter.     George Hugh [facility] Short Stay Center/Anesthesiology Phone [phone] 11/04/2017 4:38 PM

## 2017-11-07 ENCOUNTER — Telehealth: Payer: Self-pay | Admitting: Internal Medicine

## 2017-11-07 NOTE — Telephone Encounter (Signed)
Patient stated she is having her thyroid removed on Monday and is curious of what will happen next, She wanted to know how soon she should start the replacement medication. she is very nervous about this being removed and would like to start the next process ASAP   Patient gave permission to leave a detailed message if she does not answer  Please advise

## 2017-11-09 ENCOUNTER — Encounter (HOSPITAL_COMMUNITY): Payer: Self-pay | Admitting: Surgery

## 2017-11-09 DIAGNOSIS — C73 Malignant neoplasm of thyroid gland: Secondary | ICD-10-CM | POA: Diagnosis present

## 2017-11-09 NOTE — H&P (Signed)
General Surgery [facility]  Jeanne Roberts DOB: [birth year] Married / Language: English / Race: White Female   History of Present Illness  The patient is a 52 year old female who presents with a thyroid nodule.  CHIEF COMPLAINT: thyroid neoplasm of uncertain behavior  Patient returns to discuss results of fine-needle aspiration biopsy of molecular genetic testing. Patient has a long-standing history of bilateral thyroid nodules. Previous biopsies of the dominant nodules in the left lobe abdomen benign. Recent biopsies on the nodules in the right thyroid lobe were delayed in submitting for molecular genetic testing. The nodule in the inferior right lobe returned with a report suspicious, representing a 50% risk of malignancy on molecular genetic testing. Patient returns today to discuss these results and to discuss the indications for total thyroidectomy. Patient does not currently take thyroid hormone. Most recent TSH level was normal at 0.85. Patient has noted gradual increase in the overall size of her multinodular thyroid goiter. She is developing mild compressive symptoms.   Problem List/Past Medical History NEOPLASM OF UNCERTAIN BEHAVIOR OF THYROID GLAND (D44.0)  MULTINODULAR THYROID (E04.2)   Past Surgical History  Oral Surgery   Diagnostic Studies History  Colonoscopy  within last year Mammogram  within last year Pap Smear  1-5 years ago  Allergies No Known Drug Allergies [12/27/2014]:  Medication History Collagen (Oral) Specific strength unknown - Active. Ibuprofen (Oral) Specific strength unknown - Active. Medications Reconciled  Social History Alcohol use  Occasional alcohol use. Caffeine use  Coffee. No drug use  Tobacco use  Never smoker.  Family History  Breast Cancer  Family Members In General. Diabetes Mellitus  Father. Hypertension  Mother. Thyroid problems  Mother, Sister.  Pregnancy / Birth History  Age  at menarche  72 years. Age of menopause  48-55 Gravida  0 Irregular periods  Para  0  Other Problems No pertinent past medical history   Vitals Weight: 134 lb Height: 66in Body Surface Area: 1.69 m Body Mass Index: 21.63 kg/m  Temp.: 97.76F(Temporal)  Pulse: 65 (Regular)  BP: 118/64 (Sitting, Left Arm, Standard)  Physical Exam  See vital signs recorded above  GENERAL APPEARANCE Development: normal Nutritional status: normal Gross deformities: none  SKIN Rash, lesions, ulcers: none Induration, erythema: none Nodules: none palpable  EYES Conjunctiva and lids: normal Pupils: equal and reactive Iris: normal bilaterally  EARS, NOSE, MOUTH, THROAT External ears: no lesion or deformity External nose: no lesion or deformity Hearing: grossly normal Lips: no lesion or deformity Dentition: normal for age Oral mucosa: moist  NECK Symmetric: no Trachea: midline Thyroid: Dominant soft mass anterior left thyroid lobe extended beneath the left clavicle. Prominent isthmus. Slight deviation of the trachea towards the right. Palpable nodules right thyroid lobe. Nontender. No associated lymphadenopathy.  CHEST Respiratory effort: normal Retraction or accessory muscle use: no Breath sounds: normal bilaterally Rales, rhonchi, wheeze: none  CARDIOVASCULAR Auscultation: regular rhythm, normal rate Murmurs: none Pulses: carotid and radial pulse 2+ palpable Lower extremity edema: none Lower extremity varicosities: none  MUSCULOSKELETAL Station and gait: normal Digits and nails: no clubbing or cyanosis Muscle strength: grossly normal all extremities Range of motion: grossly normal all extremities Deformity: none  LYMPHATIC Cervical: none palpable Supraclavicular: none palpable  PSYCHIATRIC Oriented to person, place, and time: yes Mood and affect: normal for situation Judgment and insight: appropriate for situation    Assessment & Plan Earnstine Regal MD; 10/09/2017 3:29 PM) NEOPLASM OF UNCERTAIN BEHAVIOR OF THYROID GLAND (D44.0) MULTINODULAR THYROID (E04.2)  Current Plans Patient returns today to review her molecular genetic study and to discuss total thyroidectomy for definitive diagnosis and management of bilateral thyroid nodules with cytologic atypia.  We discussed the procedure of total thyroidectomy. We discussed the location of the surgical incision. We discussed potential risks including the risk of injury to recurrent laryngeal nerve and the parathyroid glands. We discussed the hospital stay to be anticipated. We discussed her postoperative recovery. We discussed the need for lifelong thyroid hormone replacement. We discussed the potential need for radioactive iodine treatment. Patient understands and wishes to proceed with surgery in the near future.  The risks and benefits of the procedure have been discussed at length with the patient. The patient understands the proposed procedure, potential alternative treatments, and the course of recovery to be expected. All of the patient's questions have been answered at this time. The patient wishes to proceed with surgery.   Armandina Gemma, [place] Surgery Office: [phone]

## 2017-11-10 ENCOUNTER — Encounter (HOSPITAL_COMMUNITY): Payer: Self-pay | Admitting: Anesthesiology

## 2017-11-10 ENCOUNTER — Observation Stay (HOSPITAL_COMMUNITY)
Admission: RE | Admit: 2017-11-10 | Discharge: 2017-11-11 | Disposition: A | Discharge status: Home / Self Care | Payer: BLUE CROSS/BLUE SHIELD | Source: Ambulatory Visit | Attending: Surgery | Admitting: Surgery

## 2017-11-10 ENCOUNTER — Ambulatory Visit (HOSPITAL_COMMUNITY): Payer: BLUE CROSS/BLUE SHIELD | Admitting: Anesthesiology

## 2017-11-10 ENCOUNTER — Ambulatory Visit (HOSPITAL_COMMUNITY): Payer: BLUE CROSS/BLUE SHIELD | Admitting: Vascular Surgery

## 2017-11-10 ENCOUNTER — Encounter (HOSPITAL_COMMUNITY)
Admission: RE | Disposition: A | Discharge status: Home / Self Care | Payer: Self-pay | Source: Ambulatory Visit | Attending: Surgery

## 2017-11-10 DIAGNOSIS — D440 Neoplasm of uncertain behavior of thyroid gland: Secondary | ICD-10-CM | POA: Diagnosis not present

## 2017-11-10 DIAGNOSIS — C73 Malignant neoplasm of thyroid gland: Secondary | ICD-10-CM | POA: Diagnosis not present

## 2017-11-10 DIAGNOSIS — E042 Nontoxic multinodular goiter: Secondary | ICD-10-CM

## 2017-11-10 DIAGNOSIS — Z8249 Family history of ischemic heart disease and other diseases of the circulatory system: Secondary | ICD-10-CM | POA: Insufficient documentation

## 2017-11-10 HISTORY — PX: THYROIDECTOMY: SHX17

## 2017-11-10 SURGERY — THYROIDECTOMY
Anesthesia: General | Site: Neck

## 2017-11-10 MED ORDER — FENTANYL CITRATE (PF) 100 MCG/2ML IJ SOLN
INTRAMUSCULAR | Status: AC
  Filled 2017-11-10: qty 2

## 2017-11-10 MED ORDER — FENTANYL CITRATE (PF) 100 MCG/2ML IJ SOLN
INTRAMUSCULAR | Status: DC | PRN
  Administered 2017-11-10 (×5): 50 ug via INTRAVENOUS

## 2017-11-10 MED ORDER — ACETAMINOPHEN 325 MG PO TABS: 650.0000 mg | ORAL_TABLET | Freq: Four times a day (QID) | ORAL | Status: DC | PRN

## 2017-11-10 MED ORDER — FENTANYL CITRATE (PF) 250 MCG/5ML IJ SOLN
INTRAMUSCULAR | Status: AC
  Filled 2017-11-10: qty 5

## 2017-11-10 MED ORDER — HYDROCODONE-ACETAMINOPHEN 5-325 MG PO TABS
1.0000 | ORAL_TABLET | ORAL | Status: DC | PRN
  Administered 2017-11-10: 2 via ORAL
  Administered 2017-11-10: 1 via ORAL
  Filled 2017-11-10: qty 1
  Filled 2017-11-10: qty 2

## 2017-11-10 MED ORDER — ONDANSETRON HCL 4 MG/2ML IJ SOLN
INTRAMUSCULAR | Status: AC
  Filled 2017-11-10: qty 2

## 2017-11-10 MED ORDER — MIDAZOLAM HCL 2 MG/2ML IJ SOLN
INTRAMUSCULAR | Status: AC
  Filled 2017-11-10: qty 2

## 2017-11-10 MED ORDER — MIDAZOLAM HCL 5 MG/5ML IJ SOLN
INTRAMUSCULAR | Status: DC | PRN
  Administered 2017-11-10: 2 mg via INTRAVENOUS

## 2017-11-10 MED ORDER — ONDANSETRON 4 MG PO TBDP: 4.0000 mg | ORAL_TABLET | Freq: Four times a day (QID) | ORAL | Status: DC | PRN

## 2017-11-10 MED ORDER — OXYCODONE HCL 5 MG/5ML PO SOLN: 5.0000 mg | Freq: Once | ORAL | Status: DC | PRN

## 2017-11-10 MED ORDER — ARTIFICIAL TEARS OPHTHALMIC OINT
TOPICAL_OINTMENT | OPHTHALMIC | Status: AC
  Filled 2017-11-10: qty 3.5

## 2017-11-10 MED ORDER — ROCURONIUM BROMIDE 50 MG/5ML IV SOSY
PREFILLED_SYRINGE | INTRAVENOUS | Status: DC | PRN
  Administered 2017-11-10 (×2): 10 mg via INTRAVENOUS
  Administered 2017-11-10: 50 mg via INTRAVENOUS

## 2017-11-10 MED ORDER — LIDOCAINE 2% (20 MG/ML) 5 ML SYRINGE
INTRAMUSCULAR | Status: AC
  Filled 2017-11-10: qty 5

## 2017-11-10 MED ORDER — SUGAMMADEX SODIUM 200 MG/2ML IV SOLN
INTRAVENOUS | Status: DC | PRN
  Administered 2017-11-10: 200 mg via INTRAVENOUS

## 2017-11-10 MED ORDER — OXYCODONE HCL 5 MG PO TABS: 5.0000 mg | ORAL_TABLET | Freq: Once | ORAL | Status: DC | PRN

## 2017-11-10 MED ORDER — ACETAMINOPHEN 650 MG RE SUPP: 650.0000 mg | Freq: Four times a day (QID) | RECTAL | Status: DC | PRN

## 2017-11-10 MED ORDER — ONDANSETRON HCL 4 MG/2ML IJ SOLN
INTRAMUSCULAR | Status: DC | PRN
  Administered 2017-11-10: 4 mg via INTRAVENOUS

## 2017-11-10 MED ORDER — CHLORHEXIDINE GLUCONATE CLOTH 2 % EX PADS: 6.0000 | MEDICATED_PAD | Freq: Once | CUTANEOUS | Status: DC

## 2017-11-10 MED ORDER — SODIUM CHLORIDE 0.9 % IV SOLN
INTRAVENOUS | Status: DC | PRN
  Administered 2017-11-10: 10 ug/min via INTRAVENOUS

## 2017-11-10 MED ORDER — ONDANSETRON HCL 4 MG/2ML IJ SOLN
4.0000 mg | Freq: Four times a day (QID) | INTRAMUSCULAR | Status: DC | PRN
  Filled 2017-11-10: qty 2

## 2017-11-10 MED ORDER — HYDROMORPHONE HCL 1 MG/ML IJ SOLN
1.0000 mg | INTRAMUSCULAR | Status: DC | PRN
  Administered 2017-11-10: 1 mg via INTRAVENOUS
  Filled 2017-11-10: qty 1

## 2017-11-10 MED ORDER — LIDOCAINE 2% (20 MG/ML) 5 ML SYRINGE
INTRAMUSCULAR | Status: DC | PRN
  Administered 2017-11-10: 50 mg via INTRAVENOUS

## 2017-11-10 MED ORDER — LACTATED RINGERS IV SOLN
INTRAVENOUS | Status: DC | PRN
  Administered 2017-11-10 (×2): via INTRAVENOUS

## 2017-11-10 MED ORDER — ROCURONIUM BROMIDE 50 MG/5ML IV SOSY
PREFILLED_SYRINGE | INTRAVENOUS | Status: AC
  Filled 2017-11-10: qty 5

## 2017-11-10 MED ORDER — STERILE WATER FOR IRRIGATION IR SOLN
Status: DC | PRN
  Administered 2017-11-10: 200 mL

## 2017-11-10 MED ORDER — PROPOFOL 10 MG/ML IV BOLUS
INTRAVENOUS | Status: AC
  Filled 2017-11-10: qty 20

## 2017-11-10 MED ORDER — PHENYLEPHRINE 40 MCG/ML (10ML) SYRINGE FOR IV PUSH (FOR BLOOD PRESSURE SUPPORT)
PREFILLED_SYRINGE | INTRAVENOUS | Status: DC | PRN
  Administered 2017-11-10: 80 ug via INTRAVENOUS

## 2017-11-10 MED ORDER — CEFAZOLIN SODIUM-DEXTROSE 2-4 GM/100ML-% IV SOLN
2.0000 g | INTRAVENOUS | Status: AC
  Administered 2017-11-10: 2 g via INTRAVENOUS
  Filled 2017-11-10: qty 100

## 2017-11-10 MED ORDER — FENTANYL CITRATE (PF) 100 MCG/2ML IJ SOLN
25.0000 ug | INTRAMUSCULAR | Status: DC | PRN
  Administered 2017-11-10 (×3): 50 ug via INTRAVENOUS

## 2017-11-10 MED ORDER — HEMOSTATIC AGENTS (NO CHARGE) OPTIME
TOPICAL | Status: DC | PRN
  Administered 2017-11-10: 1 via TOPICAL

## 2017-11-10 MED ORDER — SUGAMMADEX SODIUM 500 MG/5ML IV SOLN
INTRAVENOUS | Status: AC
  Filled 2017-11-10: qty 5

## 2017-11-10 MED ORDER — ONDANSETRON HCL 4 MG/2ML IJ SOLN
4.0000 mg | Freq: Once | INTRAMUSCULAR | Status: AC | PRN
  Administered 2017-11-10: 4 mg via INTRAVENOUS

## 2017-11-10 MED ORDER — LEVOTHYROXINE SODIUM 88 MCG PO TABS
88.0000 ug | ORAL_TABLET | Freq: Every day | ORAL | Status: DC
  Administered 2017-11-11: 88 ug via ORAL
  Filled 2017-11-10: qty 1

## 2017-11-10 MED ORDER — PHENYLEPHRINE 40 MCG/ML (10ML) SYRINGE FOR IV PUSH (FOR BLOOD PRESSURE SUPPORT)
PREFILLED_SYRINGE | INTRAVENOUS | Status: AC
  Filled 2017-11-10: qty 10

## 2017-11-10 MED ORDER — DEXAMETHASONE SODIUM PHOSPHATE 10 MG/ML IJ SOLN
INTRAMUSCULAR | Status: DC | PRN
  Administered 2017-11-10: 10 mg via INTRAVENOUS

## 2017-11-10 MED ORDER — 0.9 % SODIUM CHLORIDE (POUR BTL) OPTIME
TOPICAL | Status: DC | PRN
  Administered 2017-11-10: 1000 mL

## 2017-11-10 MED ORDER — PROPOFOL 10 MG/ML IV BOLUS
INTRAVENOUS | Status: DC | PRN
  Administered 2017-11-10: 140 mg via INTRAVENOUS

## 2017-11-10 MED ORDER — CALCIUM CARBONATE 1250 (500 CA) MG PO TABS
2.0000 | ORAL_TABLET | Freq: Three times a day (TID) | ORAL | Status: DC
  Administered 2017-11-10 – 2017-11-11 (×2): 1000 mg via ORAL
  Filled 2017-11-10 (×4): qty 1

## 2017-11-10 MED ORDER — TRAMADOL HCL 50 MG PO TABS: 50.0000 mg | ORAL_TABLET | Freq: Four times a day (QID) | ORAL | Status: DC | PRN

## 2017-11-10 MED ORDER — DEXAMETHASONE SODIUM PHOSPHATE 10 MG/ML IJ SOLN
INTRAMUSCULAR | Status: AC
  Filled 2017-11-10: qty 1

## 2017-11-10 MED ORDER — KCL IN DEXTROSE-NACL 20-5-0.45 MEQ/L-%-% IV SOLN
INTRAVENOUS | Status: DC
  Administered 2017-11-10: 12:00:00 via INTRAVENOUS
  Filled 2017-11-10: qty 1000

## 2017-11-10 SURGICAL SUPPLY — 60 items
ATTRACTOMAT 16X20 MAGNETIC DRP (DRAPES) ×2 IMPLANT
BLADE CLIPPER SURG (BLADE) IMPLANT
BLADE SURG 10 STRL SS (BLADE) IMPLANT
BLADE SURG 15 STRL LF DISP TIS (BLADE) ×1 IMPLANT
BLADE SURG 15 STRL SS (BLADE) ×1
CANISTER SUCT 3000ML PPV (MISCELLANEOUS) IMPLANT
CHLORAPREP W/TINT 10.5 ML (MISCELLANEOUS) ×2 IMPLANT
CLIP VESOCCLUDE MED 24/CT (CLIP) ×2 IMPLANT
CLIP VESOCCLUDE SM WIDE 24/CT (CLIP) ×2 IMPLANT
COVER SURGICAL LIGHT HANDLE (MISCELLANEOUS) ×2 IMPLANT
COVER WAND RF STERILE (DRAPES) IMPLANT
CRADLE DONUT ADULT HEAD (MISCELLANEOUS) ×2 IMPLANT
DRAPE LAPAROTOMY 100X72 PEDS (DRAPES) ×2 IMPLANT
DRAPE OEC MINIVIEW 54X84 (DRAPES) ×2 IMPLANT
DRAPE UTILITY XL STRL (DRAPES) ×2 IMPLANT
ELECT CAUTERY BLADE 6.4 (BLADE) ×2 IMPLANT
ELECT REM PT RETURN 9FT ADLT (ELECTROSURGICAL) ×2
ELECTRODE REM PT RTRN 9FT ADLT (ELECTROSURGICAL) ×1 IMPLANT
GAUZE 4X4 16PLY RFD (DISPOSABLE) ×4 IMPLANT
GAUZE SPONGE 4X4 12PLY STRL (GAUZE/BANDAGES/DRESSINGS) ×2 IMPLANT
GAUZE SPONGE 4X4 12PLY STRL LF (GAUZE/BANDAGES/DRESSINGS) ×2 IMPLANT
GLOVE BIO SURGEON STRL SZ 6.5 (GLOVE) ×2 IMPLANT
GLOVE BIO SURGEON STRL SZ7.5 (GLOVE) ×2 IMPLANT
GLOVE BIOGEL PI IND STRL 6.5 (GLOVE) ×1 IMPLANT
GLOVE BIOGEL PI IND STRL 7.0 (GLOVE) ×1 IMPLANT
GLOVE BIOGEL PI IND STRL 7.5 (GLOVE) ×1 IMPLANT
GLOVE BIOGEL PI IND STRL 8 (GLOVE) ×1 IMPLANT
GLOVE BIOGEL PI INDICATOR 6.5 (GLOVE) ×1
GLOVE BIOGEL PI INDICATOR 7.0 (GLOVE) ×1
GLOVE BIOGEL PI INDICATOR 7.5 (GLOVE) ×1
GLOVE BIOGEL PI INDICATOR 8 (GLOVE) ×1
GLOVE SURG ORTHO 8.0 STRL STRW (GLOVE) ×2 IMPLANT
GOWN STRL REUS W/ TWL LRG LVL3 (GOWN DISPOSABLE) ×4 IMPLANT
GOWN STRL REUS W/ TWL XL LVL3 (GOWN DISPOSABLE) ×1 IMPLANT
GOWN STRL REUS W/TWL LRG LVL3 (GOWN DISPOSABLE) ×4
GOWN STRL REUS W/TWL XL LVL3 (GOWN DISPOSABLE) ×1
HEMOSTAT ARISTA ABSORB 3G PWDR (MISCELLANEOUS) IMPLANT
HEMOSTAT SURGICEL 2X4 FIBR (HEMOSTASIS) ×2 IMPLANT
ILLUMINATOR WAVEGUIDE N/F (MISCELLANEOUS) ×2 IMPLANT
KIT BASIN OR (CUSTOM PROCEDURE TRAY) ×2 IMPLANT
KIT TURNOVER KIT B (KITS) ×2 IMPLANT
LIGHT WAVEGUIDE WIDE FLAT (MISCELLANEOUS) IMPLANT
MANIFOLD NEPTUNE WASTE (CANNULA) ×2 IMPLANT
NS IRRIG 1000ML POUR BTL (IV SOLUTION) ×2 IMPLANT
PACK SURGICAL SETUP 50X90 (CUSTOM PROCEDURE TRAY) ×2 IMPLANT
PAD ARMBOARD 7.5X6 YLW CONV (MISCELLANEOUS) ×2 IMPLANT
PENCIL BUTTON HOLSTER BLD 10FT (ELECTRODE) ×2 IMPLANT
PENCIL SMOKE EVACUATOR (MISCELLANEOUS) ×2 IMPLANT
SHEARS HARMONIC 9CM CVD (BLADE) ×2 IMPLANT
SPECIMEN JAR MEDIUM (MISCELLANEOUS) ×2 IMPLANT
SPONGE INTESTINAL PEANUT (DISPOSABLE) ×2 IMPLANT
STRIP CLOSURE SKIN 1/2X4 (GAUZE/BANDAGES/DRESSINGS) ×2 IMPLANT
SUT MNCRL AB 4-0 PS2 18 (SUTURE) ×2 IMPLANT
SUT SILK 2 0 (SUTURE)
SUT SILK 2-0 18XBRD TIE 12 (SUTURE) IMPLANT
SUT VIC AB 3-0 SH 18 (SUTURE) ×4 IMPLANT
SYR BULB 3OZ (MISCELLANEOUS) ×2 IMPLANT
TOWEL OR 17X24 6PK STRL BLUE (TOWEL DISPOSABLE) ×2 IMPLANT
TOWEL OR 17X26 10 PK STRL BLUE (TOWEL DISPOSABLE) IMPLANT
TUBE CONNECTING 12X1/4 (SUCTIONS) ×2 IMPLANT

## 2017-11-10 NOTE — Anesthesia Procedure Notes (Signed)
Procedure Name: Intubation Date/Time: 11/10/2017 7:38 AM Performed by: Kyung Rudd, CRNA Pre-anesthesia Checklist: Patient identified, Emergency Drugs available, Suction available and Patient being monitored Patient Re-evaluated:Patient Re-evaluated prior to induction Oxygen Delivery Method: Circle system utilized Preoxygenation: Pre-oxygenation with 100% oxygen Induction Type: IV induction Ventilation: Mask ventilation without difficulty Laryngoscope Size: Glidescope and 3 Tube type: Oral Tube size: 7.0 mm Number of attempts: 1 Airway Equipment and Method: Stylet and Video-laryngoscopy Placement Confirmation: ETT inserted through vocal cords under direct vision,  positive ETCO2 and breath sounds checked- equal and bilateral Secured at: 20 cm Tube secured with: Tape Dental Injury: Teeth and Oropharynx as per pre-operative assessment  Comments: Electively used Glidescope due to enlarged neck mass. AOI using Glidescope, Grade 1 view on screen.

## 2017-11-10 NOTE — Op Note (Signed)
Procedure Note  Pre-operative Diagnosis:  Thyroid neoplasm of uncertain behavior, multiple thyroid nodules  Post-operative Diagnosis:  same  Surgeon:  Armandina Gemma, MD  Assistant:  Sharyn Dross, RNFA   Procedure:  Total thyroidectomy  Anesthesia:  General  Estimated Blood Loss:  minimal  Drains: none         Specimen: thyroid to pathology  Indications:  Patient returns to discuss results of fine-needle aspiration biopsy of molecular genetic testing. Patient has a long-standing history of bilateral thyroid nodules. Previous biopsies of the dominant nodules in the left lobe abdomen benign. Recent biopsies on the nodules in the right thyroid lobe were delayed in submitting for molecular genetic testing. The nodule in the inferior right lobe returned with a report suspicious, representing a 50% risk of malignancy on molecular genetic testing. Patient returns today to discuss these results and to discuss the indications for total thyroidectomy. Patient does not currently take thyroid hormone. Most recent TSH level was normal at 0.85. Patient has noted gradual increase in the overall size of her multinodular thyroid goiter. She is developing mild compressive symptoms.  Procedure Details: Procedure was done in OR #2 at the [facility].  The patient was brought to the operating room and placed in a supine position on the operating room table.  Following administration of general anesthesia, the patient was positioned and then prepped and draped in the usual aseptic fashion.  After ascertaining that an adequate level of anesthesia had been achieved, a Kocher incision was made with #15 blade.  Dissection was carried through subcutaneous tissues and platysma. Hemostasis was achieved with the electrocautery.  Skin flaps were elevated cephalad and caudad from the thyroid notch to the sternal notch.  The [place] self-retaining retractor was placed for exposure.  Strap muscles were incised  in the midline and dissection was begun on the left side.  Strap muscles were reflected laterally.  Left thyroid lobe was markedly enlarged and very soft.  The left lobe was gently mobilized with blunt dissection.  Superior pole vessels were dissected out and divided individually between small and medium Ligaclips with the Harmonic scalpel.  The thyroid lobe was rolled anteriorly.  Branches of the inferior thyroid artery were divided between small Ligaclips with the Harmonic scalpel.  Inferior venous tributaries were divided between Ligaclips.  Both the superior and inferior parathyroid glands were identified and preserved on their vascular pedicles.  The recurrent laryngeal nerve was identified and preserved along its course.  The ligament of Gwenlyn Found was released with the electrocautery and the gland was mobilized onto the anterior trachea. Isthmus was mobilized across the midline.  There was a small pyramidal lobe present which was resected with the isthmus.  Dry pack was placed in the left neck.  Next, the right thyroid lobe was gently mobilized with blunt dissection.  Right thyroid lobe was moderately enlarged with a dominant nodule posteriorly.  Superior pole vessels were dissected out and divided between small and medium Ligaclips with the Harmonic scalpel.  Superior parathyroid was identified and preserved.  Inferior venous tributaries were divided between medium Ligaclips with the Harmonic scalpel.  The right thyroid lobe was rolled anteriorly and the branches of the inferior thyroid artery divided between small Ligaclips.  The right recurrent laryngeal nerve was identified and preserved along its course.  The ligament of Gwenlyn Found was released with the electrocautery.  The right thyroid lobe was mobilized onto the anterior trachea and the remainder of the thyroid was dissected off the anterior trachea  and the thyroid was completely excised.  A suture was used to mark the left lobe. The entire thyroid gland was  submitted to pathology for review.  The neck was irrigated with warm saline.  Fibrillar was placed throughout the operative field.  Strap muscles were reapproximated in the midline with interrupted 3-0 Vicryl sutures.  Platysma was closed with interrupted 3-0 Vicryl sutures.  Skin was closed with a running 4-0 Monocryl subcuticular suture.  Wound was washed and dried and steri-strips were applied.  Dry gauze dressing was placed.  The patient was awakened from anesthesia and brought to the recovery room.  The patient tolerated the procedure well.   Armandina Gemma, MD [facility] Office: [phone]

## 2017-11-10 NOTE — Interval H&P Note (Signed)
History and Physical Interval Note:  11/10/2017 7:02 AM  Jeanne Roberts  has presented today for surgery, with the diagnosis of thyroid neoplasm of uncertian behavior , multiple thyroid nodules.  The various methods of treatment have been discussed with the patient and family. After consideration of risks, benefits and other options for treatment, the patient has consented to    Procedure(s): TOTAL THYROIDECTOMY (N/A) as a surgical intervention .    The patient's history has been reviewed, patient examined, no change in status, stable for surgery.  I have reviewed the patient's chart and labs.  Questions were answered to the patient's satisfaction.    Armandina Gemma, [place] Surgery Office: [place]

## 2017-11-10 NOTE — Anesthesia Postprocedure Evaluation (Signed)
Anesthesia Post Note  Patient: Jeanne Roberts  Procedure(s) Performed: TOTAL THYROIDECTOMY (N/A Neck)     Patient location during evaluation: Nursing Unit Anesthesia Type: General Level of consciousness: awake and alert, awake, oriented and patient cooperative Pain management: pain level controlled Vital Signs Assessment: post-procedure vital signs reviewed and stable Respiratory status: spontaneous breathing, nonlabored ventilation, respiratory function stable and patient connected to nasal cannula oxygen Cardiovascular status: blood pressure returned to baseline and stable Postop Assessment: no apparent nausea or vomiting Anesthetic complications: no    Last Vitals:  Vitals:   11/10/17 1257 11/10/17 2059  BP: 105/73 114/67  Pulse: 62 63  Resp: 14 17  Temp: 36.6 C 37 C  SpO2: 100% 97%    Last Pain:  Vitals:   11/10/17 2147  TempSrc:   PainSc: 0-No pain                 Hatsue Sime COKER

## 2017-11-10 NOTE — Telephone Encounter (Signed)
I only got this msg today, I see it was sent 3 days ago... Please tell her not to worry too much. Dr. Harlow Asa is excellent. She will start on Levothyroxine right away and will need labs in 5-6 weeks afterwards and will adjust the dose then.  It is important to take the thyroid hormone every day, with water, at least 30 minutes before breakfast, separated by at least 4 hours from: - acid reflux medications - calcium - iron - multivitamins

## 2017-11-10 NOTE — Anesthesia Preprocedure Evaluation (Addendum)
Anesthesia Evaluation  Patient identified by MRN, date of birth, ID band Patient awake    Reviewed: Allergy & Precautions, NPO status , Patient's Chart, lab work & pertinent test results  Airway Mallampati: I       Dental  (+) Teeth Intact, Dental Advisory Given   Pulmonary    breath sounds clear to auscultation       Cardiovascular  Rhythm:Regular Rate:Normal     Neuro/Psych    GI/Hepatic   Endo/Other    Renal/GU      Musculoskeletal   Abdominal   Peds  Hematology   Anesthesia Other Findings   Reproductive/Obstetrics                            Anesthesia Physical Anesthesia Plan  ASA: II  Anesthesia Plan: General   Post-op Pain Management:    Induction:   PONV Risk Score and Plan:   Airway Management Planned: Oral ETT  Additional Equipment:   Intra-op Plan:   Post-operative Plan: Extubation in OR  Informed Consent: I have reviewed the patients History and Physical, chart, labs and discussed the procedure including the risks, benefits and alternatives for the proposed anesthesia with the patient or authorized representative who has indicated his/her understanding and acceptance.   Dental advisory given  Plan Discussed with: CRNA and Anesthesiologist  Anesthesia Plan Comments:        Anesthesia Quick Evaluation

## 2017-11-10 NOTE — Transfer of Care (Signed)
Immediate Anesthesia Transfer of Care Note  Patient: Jeanne Roberts  Procedure(s) Performed: TOTAL THYROIDECTOMY (N/A Neck)  Patient Location: PACU  Anesthesia Type:General  Level of Consciousness: awake, alert  and oriented  Airway & Oxygen Therapy: Patient Spontanous Breathing and Patient connected to nasal cannula oxygen  Post-op Assessment: Report given to RN, Post -op Vital signs reviewed and stable and Patient moving all extremities  Post vital signs: Reviewed and stable  Last Vitals:  Vitals Value Taken Time  BP 127/83 11/10/2017  9:42 AM  Temp    Pulse 72 11/10/2017  9:47 AM  Resp 17 11/10/2017  9:47 AM  SpO2 100 % 11/10/2017  9:47 AM  Vitals shown include unvalidated device data.  Last Pain:  Vitals:   11/10/17 0638  TempSrc: Oral  PainSc:       Patients Stated Pain Goal: 2 (82/99/37 1696)  Complications: No apparent anesthesia complications

## 2017-11-11 ENCOUNTER — Encounter (HOSPITAL_COMMUNITY): Payer: Self-pay | Admitting: Surgery

## 2017-11-11 DIAGNOSIS — Z8249 Family history of ischemic heart disease and other diseases of the circulatory system: Secondary | ICD-10-CM | POA: Diagnosis not present

## 2017-11-11 DIAGNOSIS — C73 Malignant neoplasm of thyroid gland: Secondary | ICD-10-CM | POA: Diagnosis not present

## 2017-11-11 DIAGNOSIS — E042 Nontoxic multinodular goiter: Secondary | ICD-10-CM | POA: Diagnosis not present

## 2017-11-11 LAB — BASIC METABOLIC PANEL
Anion gap: 10 (ref 5–15)
BUN: 9 mg/dL (ref 6–20)
CALCIUM: 8.9 mg/dL (ref 8.9–10.3)
CO2: 25 mmol/L (ref 22–32)
CREATININE: 0.74 mg/dL (ref 0.44–1.00)
Chloride: 102 mmol/L (ref 98–111)
GFR calc Af Amer: >60 mL/min (ref >60)
GLUCOSE: 143 mg/dL — AB (ref 70–99)
POTASSIUM: 3.6 mmol/L (ref 3.5–5.1)
SODIUM: 137 mmol/L (ref 135–145)

## 2017-11-11 MED ORDER — CALCIUM CARBONATE ANTACID 500 MG PO CHEW: 2.0000 | CHEWABLE_TABLET | Freq: Two times a day (BID) | ORAL | 1 refills | Status: DC

## 2017-11-11 MED ORDER — LEVOTHYROXINE SODIUM 88 MCG PO TABS: 88.0000 ug | ORAL_TABLET | Freq: Every day | ORAL | 11 refills | Status: DC

## 2017-11-11 NOTE — Telephone Encounter (Signed)
M,  Patient had her thyroidectomy today and will go home later today. Let's give her a call to schedule an appointment for follow-up in 5 to 6 weeks from now.  Sincerely,  [name] MD

## 2017-11-11 NOTE — Discharge Summary (Signed)
Physician Discharge Summary [facility]  Patient ID: Jeanne Roberts MRN: [id number] DOB/AGE: 1965/12/01 52 y.o.  Admit date: 11/10/2017 Discharge date: 11/11/2017  Admission Diagnoses:  Thyroid neoplasm of uncertain behavior; multiple thyroid nodules  Discharge Diagnoses:  Principal Problem:   Neoplasm of uncertain behavior of thyroid gland Active Problems:   Multiple thyroid nodules   Discharged Condition: good  Hospital Course: Patient was admitted for observation following thyroid surgery.  Post op course was uncomplicated.  Pain was well controlled.  Tolerated diet.  Post op calcium level on morning following surgery was 8.9 mg/dl.  Patient was prepared for discharge home on POD#1.  Consults: None  Treatments: surgery: total thyroidectomy  Discharge Exam: Blood pressure 118/69, pulse (!) 53, temperature 98.3 F (36.8 C), temperature source Oral, resp. rate 18, height 5\' 6"  (1.676 m), weight 59.9 kg, SpO2 100 %. HEENT - clear Neck - wound dry and intact; minimal soft tissue swelling; voice normal Chest - clear bilaterally Cor - RRR  Disposition: Home  Discharge Instructions    Diet - low sodium heart healthy   Complete by:  As directed    Discharge instructions   Complete by:  As directed    [facility]  THYROID & PARATHYROID SURGERY:  POST-OP INSTRUCTIONS  Always review your discharge instruction sheet from the facility where your surgery was performed.  A prescription for pain medication may be given to you upon discharge.  Take your pain medication as prescribed.  If narcotic pain medicine is not needed, then you may take acetaminophen (Tylenol) or ibuprofen (Advil) as needed.  Take your usually prescribed medications unless otherwise directed.  If you need a refill on your pain medication, please contact our office during regular business hours.  Prescriptions cannot be processed by our office after 5 pm or on  weekends.  Start with a light diet upon arrival home, such as soup and crackers or toast.  Be sure to drink plenty of fluids daily.  Resume your normal diet the day after surgery.  Most patients will experience some swelling and bruising on the chest and neck area.  Ice packs will help.  Swelling and bruising can take several days to resolve.   It is common to experience some constipation after surgery.  Increasing fluid intake and taking a stool softener (Colace) will usually help or prevent this problem.  A mild laxative (Milk of Magnesia or Miralax) should be taken according to package directions if there has been no bowel movement after 48 hours.  You have steri-strips and a gauze dressing over your incision.  You may remove the gauze bandage on the second day after surgery, and you may shower at that time.  Leave your steri-strips (small skin tapes) in place directly over the incision.  These strips should remain on the skin for 5-7 days and then be removed.  You may get them wet in the shower and pat them dry.  You may resume regular (light) daily activities beginning the next day (such as daily self-care, walking, climbing stairs) gradually increasing activities as tolerated.  You may have sexual intercourse when it is comfortable.  Refrain from any heavy lifting or straining until approved by your doctor.  You may drive when you no longer are taking prescription pain medication, you can comfortably wear a seatbelt, and you can safely maneuver your car and apply brakes.  You should see your doctor in the office for a follow-up appointment approximately three  weeks after your surgery.  Make sure that you call for this appointment within a day or two after you arrive home to insure a convenient appointment time.  WHEN TO CALL YOUR DOCTOR: -- Fever greater than 101.5 -- Inability to urinate -- Nausea and/or vomiting - persistent -- Extreme swelling or bruising -- Continued bleeding from  incision -- Increased pain, redness, or drainage from the incision -- Difficulty swallowing or breathing -- Muscle cramping or spasms -- Numbness or tingling in hands or around lips  The clinic staff is available to answer your questions during regular business hours.  Please don't hesitate to call and ask to speak to one of the nurses if you have concerns.  Armandina Gemma, MD [facility] Office: [phone]   Increase activity slowly   Complete by:  As directed    Remove dressing in 24 hours   Complete by:  As directed      Allergies as of 11/11/2017   No Known Allergies     Medication List    TAKE these medications   calcium carbonate 500 MG chewable tablet Commonly known as:  TUMS - dosed in mg elemental calcium Chew 2 tablets (400 mg of elemental calcium total) by mouth 2 (two) times daily.   EVENING PRIMROSE OIL PO Take 1 capsule by mouth See admin instructions. Take 1 capsule by mouth 5 times weekly   ibuprofen 200 MG tablet Commonly known as:  ADVIL,MOTRIN Take 400 mg by mouth every 6 (six) hours as needed for headache, mild pain or moderate pain.   levothyroxine 88 MCG tablet Commonly known as:  SYNTHROID, LEVOTHROID Take 1 tablet (88 mcg total) by mouth daily.   OVER THE COUNTER MEDICATION Take 1 tablet by mouth daily. Nurturlax with Aloe Supplement   PROBIOTIC PO Take 1 capsule by mouth daily.   VIACTIV PO Take 1 tablet by mouth daily.      Follow-up Information    Armandina Gemma, MD. Schedule an appointment as soon as possible for a visit in 3 week(s).   Specialty:  General Surgery Contact information: [address] [place] [address] [phone]        [name], MD. Schedule an appointment as soon as possible for a visit in 4 week(s).   Specialty:  Internal Medicine Contact information: [address] [place] [address] [phone]           Chistina Roston M. Chima Astorino, MD,  [facility] Office: [phone]   Signed: Earnstine Regal 11/11/2017, 8:49 AM

## 2017-11-11 NOTE — Progress Notes (Signed)
Jeanne Roberts to be D/C'd  per MD order. Discussed with the patient and all questions fully answered.  VSS, Skin clean, dry and intact without evidence of skin break down, no evidence of skin tears noted.  IV catheter discontinued intact. Site without signs and symptoms of complications. Dressing and pressure applied.  An After Visit Summary was printed and given to the patient. Patient received prescription.  D/c education completed with patient/family including follow up instructions, medication list, d/c activities limitations if indicated, with other d/c instructions as indicated by MD - patient able to verbalize understanding, all questions fully answered.   Patient instructed to return to ED, call 911, or call MD for any changes in condition.   Patient to be escorted via [place], and D/C home via private auto.

## 2017-11-11 NOTE — Plan of Care (Signed)

## 2017-11-12 NOTE — Telephone Encounter (Signed)
Spoke to patient, she states she is doing very well and feeling great.  Patient has started the medication and we have scheduled her for the follow up.

## 2017-11-17 ENCOUNTER — Telehealth: Payer: Self-pay | Admitting: Internal Medicine

## 2017-11-17 NOTE — Telephone Encounter (Signed)
Per [facility] "Caller states her tests came back malignant, Would like the office to call her on her cell phone number."

## 2017-11-17 NOTE — Telephone Encounter (Signed)
Called and discussed with the patient about her recent diagnosis of follicular variant of papillary thyroid cancer.  I explained that this is low risk since it did not have atypical features, it was encapsulated, not extending into the lymphovascular tissue, and margins were free of tumor.  We do not need RAI treatment in this case.  She was relieved to hear this. We also discussed about how to take levothyroxine correctly.  I advised her to move calcium at least 4 hours after taking the thyroid medicine. We will discuss more when she comes to see me in less than a month.

## 2017-11-21 DIAGNOSIS — E890 Postprocedural hypothyroidism: Secondary | ICD-10-CM | POA: Diagnosis not present

## 2017-11-21 DIAGNOSIS — D440 Neoplasm of uncertain behavior of thyroid gland: Secondary | ICD-10-CM | POA: Diagnosis not present

## 2017-11-21 DIAGNOSIS — Z1231 Encounter for screening mammogram for malignant neoplasm of breast: Secondary | ICD-10-CM | POA: Diagnosis not present

## 2017-11-21 DIAGNOSIS — Z1239 Encounter for other screening for malignant neoplasm of breast: Secondary | ICD-10-CM | POA: Diagnosis not present

## 2017-12-10 ENCOUNTER — Ambulatory Visit (INDEPENDENT_AMBULATORY_CARE_PROVIDER_SITE_OTHER): Payer: BLUE CROSS/BLUE SHIELD | Admitting: Internal Medicine

## 2017-12-10 ENCOUNTER — Encounter: Payer: Self-pay | Admitting: Internal Medicine

## 2017-12-10 VITALS — BP 130/80 | HR 88 | Ht 66.0 in | Wt 132.0 lb

## 2017-12-10 DIAGNOSIS — E890 Postprocedural hypothyroidism: Secondary | ICD-10-CM | POA: Diagnosis not present

## 2017-12-10 DIAGNOSIS — C73 Malignant neoplasm of thyroid gland: Secondary | ICD-10-CM | POA: Diagnosis not present

## 2017-12-10 LAB — TSH: TSH: 1.01 u[IU]/mL (ref 0.35–4.50)

## 2017-12-10 LAB — T4, FREE: FREE T4: 1.1 ng/dL (ref 0.60–1.60)

## 2017-12-10 MED ORDER — LEVOTHYROXINE SODIUM 88 MCG PO TABS: 88.0000 ug | ORAL_TABLET | Freq: Every day | ORAL | 3 refills | Status: DC

## 2017-12-10 NOTE — Patient Instructions (Signed)
Please continue: - Levothyroxine 88 mcg daily.  Take the thyroid hormone every day, with water, at least 30 minutes before breakfast, separated by at least 4 hours from: - acid reflux medications - calcium - iron - multivitamins  Please return in 6 months for a visit and in 3 months for labs.

## 2017-12-10 NOTE — Progress Notes (Signed)
Patient ID: [name], female   DOB: Jun 07, 1965, 52 y.o.   MRN: [id number]   HPI  Jeanne Roberts is a 52 y.o.-year-old female, initially referred by Dr Harlow Asa for multiple thyroid nodules. She previously saw several endocrinologists, last: Dr. Meredith Pel.  Last visit with me 6.5 months ago.  Since last visit, we diagnosed thyroid cancer and she is now status post total thyroidectomy.  She developed postoperative hypothyroidism. PCP: Dr Theodis Sato  Reviewed and addended history:  She was dx'ed with thyroid nodules in 2005, after a thyroid nodule was found by palpation.  Thyroid nodule Bx (05/24/2003):  - Left: benign - Right: benign  Thyroid U/S (11/17/2014): Right thyroid lobe: 64 x 30 x 31 mm. 46 x 27 x 37 mm complex mostly solid mass, inferior pole. Left thyroid lobe: 65 x 38 x 39 mm. Somewhat lobular 53 x 26 x 42 mm solid hyperemic mass, inferior pole. Isthmus Thickness: 6 mm.24 x 17 x 20 mm complex cyst, right isthmus Lymphadenopathy: None visualized. Bilateral calcified carotid plaque is incidentally noted, without assessment for degree of stenosis. IMPRESSION: 1. Thyromegaly with right, left, and isthmic nodules which all meet consensus criteria for biopsy.  Thyroid nodule Bx (01/31/2015): - isthmus nodule: scant follicular epithelium: bethesda categ 1 - L thyroid nodule: benign  Thyroid U/S (07/10/2015): Right thyroid lobe: 7.2 x 3.2 x 3.6 cm. Heterogeneous lower pole nodule measured 2.8 x 4.9 x 2.5 cm and previously measured 4.6 x 2.7 x 3.7 cm. Subcentimeter right upper pole nodules are noted. Left thyroid lobe: 7.3 x 3.4 x 4.5 cm. Solid heterogeneous lower pole nodule measures 4.4 x 5.2 x 2.9 cm and previously measured up to 5.3 cm. Subcentimeter left upper pole nodules are noted. Isthmus Thickness: 0.8 cm. Hypoechoic nodule measures 1.9 x 1.4 x 2.1 cm and previously measured up to 2.4 cm. Lymphadenopathy: None visualized.  IMPRESSION: Bilateral and isthmic nodules are not  significantly changed as described above. The largest is on the left and measures up to 5.2 cm.  Thyroid U/S (11/08/2016):  Parenchymal Echotexture: Moderately heterogenous Right lobe: 7 x 2.7 x 3.3 cm, previously 7.2 x 3.2 x 3.6.  Right inferior nodule: 4.2 x 2 x 3.1 cm, previously 4.9 x 2.5 x 2.8 cm, mixed cystic and solid, hypoechoic.  Not previously biopsied  Left lobe: 7 x 3.1 x 4 cm, previously 7.3 x 3.4 x 4.5.  Lobular mid left nodule: 5.7 x 2.5 x 4.8 cm, previously 5.2 x 2.9 x 4.4; this was previously biopsied.  Isthmus: 0.9 cm thickness, previously 0.8 cm.  Very hypoechoic isthmic nodule: 1.5 x 0.8 x 1 cm, previously 2.1 x1.4 x 1.9; this was previously biopsied but with scant material.  IMPRESSION: 1. Thyromegaly with little change in bilateral and isthmic nodules. 2. Recommend FNA biopsy of mildly suspicious 4.2 cm inferior right nodule.  Plan was:  Notes recorded by [name], MD on 11/13/2016 at 12:19 PM EDT .Marland KitchenMarland KitchenHer left thyroid nodule is slightly larger, with the largest dimension increasing from 5.2 cm to 5.7 cm. This nodule was biopsied twice, including last year, and is benign. However, per my understanding, she has more neck compression symptoms and I am curious whether she is interested in thyroidectomy.  The other 2 nodules are slightly smaller, which is very good news: The isthmic nodule has been previously biopsied but the biopsy was inconclusive. If she does not desire thyroidectomy, we will need to rebiopsy this nodule and most likely also the right thyroid nodule, since this is still  large, although, again, decreased in size slightly since last check.  She continued to have neck compression symptoms >> We decided to rebiopsy the right and the isthmic thyroid nodules:  THYROID, FINE NEEDLE ASPIRATION ISTHMUS (SPECIMEN 1 OF 2, COLLECTED ON 06/29/319): SCANT FOLLICULAR EPITHELIUM PRESENT (BETHESDA CATEGORY I). [name] MD Pathologist, Electronic  Signature (Case signed 08/22/2017) Specimen Clinical Information 1.5 x 0.8 x 1cm very hypoechoic isthmic nodule, previously 2.1 x 1.4 x 1.9; this was previoulsy biopsied, Previous biopsy 1/10/17[name] 1 Source Thyroid, Fine Needle Aspiration, Isthmus, (Specimen 1 of 2, collected on 08/21/2017)  Adequacy Reason Satisfactory For Evaluation. Diagnosis THYROID, FINE NEEDLE ASPIRATION RIGHT (SPECIMEN 2 OF 2, COLLECTED ON 08/21/2017): ATYPIA OF UNDETERMINED SIGNIFICANCE OR FOLLICULAR LESION OF UNDETERMINED SIGNIFICANCE (BETHESDA CATEGORY III). COMMENT: THIS SPECIMEN WILL BE SENT FOR AFIRMA TESTING. [name] MD Pathologist, Electronic Signature (Case signed 08/22/2017) Specimen Clinical Information Right inferior 4.2cm; Other 2 dimensions: 2 x 3cm, previously 4.9 x 2.5 x 2.8cm, Mixed cystic and solid, Hypoechoic, TI-RADS total points 3   AFIRMA molecular testing performed on the right thyroid nodule returned suspicious for cancer.   I referred her to surgery and she had total thyroidectomy 11/10/2017:  Diagnosis Thyroid, thyroidectomy - PAPILLARY THYROID CARCINOMA, FOLLICULAR VARIANT, 4 CM. - TUMOR CONFINED WITHIN THYROID CAPSULE. - MARGINS NOT INVOLVED. - SEE ONCOLOGY TABLE.  THYROID GLAND: Procedure: Thyroidectomy. Tumor Focality: Unifocal. Tumor Site: Right lobe. Tumor Size: 4 x 3.2 x 2.4 cm. Histologic Type: Follicular variant of the papillary thyroid carcinoma. Margins: Free of tumor. Angioinvasion: No. Lymphatic Invasion: No. Extrathyroidal: No. Regional Lymph Nodes: No lymph nodes submitted or found. Pathologic Stage Classification (pTNM, AJCC 8th Edition): pT2, pNx Representative tumor block: 1D. Comment(s): The nodule in the right lobe has scattered foci with nuclear cytologic features consistent with papillary thyroid carcinoma, follicular variant. There is a 6.2 cm nodule in the left lobe which has follicular architecture and scattered foci of cytologic  atypia and is consistent with a follicular neoplasm without capsular or vascular invasion. There is a smaller 0.9 cm nodule in the isthmus with features of a colloid nodule. (JDP:ah 11/11/17)  Pt denies: - feeling nodules in neck - hoarseness - dysphagia - choking - SOB with lying down  She was started on levothyroxine 88 mcg daily after the surgery.  She takes this: - in am, with water - black coffee - fasting - at least 30 min from b'fast - no Fe, MVI, PPIs - + Calcium - >4h later - + collagen later - not on Biotin  Reviewed patient's TFTs - last set checked before the Sx: Lab Results  Component Value Date   TSH 0.85 05/20/2017   FREET4 0.80 05/20/2017  03/26/2016: TSH 1.62, free T4 1.19, free T3 3.53  03/20/2015: TSH 0.84, free T4 0.83, free T3 2.9  + FH of thyroid ds in mother, who had to have her thyroid removed (not cancer).  1 of her sisters was also recently found to have thyroid cancer. No history of radiation to head or neck. No Biotin use. No recent steroids use.  No herbal medicines.  She is feeling well after the surgery and restarted running.  He did not notice any weight gain, a possible side effect that she was concerned about.  Also, she feels that her hair loss has improved.  ROS: Constitutional: no weight gain/no weight loss, no fatigue, + subjective hyperthermia, no subjective hypothermia Eyes: no blurry vision, no xerophthalmia ENT: no sore throat, + see HPI Cardiovascular:  no CP/no SOB/no palpitations/no leg swelling Respiratory: no cough/no SOB/no wheezing Gastrointestinal: no N/no V/no D/no C/no acid reflux Musculoskeletal: no muscle aches/no joint aches Skin: no rashes, + hair loss - improved Neurological: no tremors/no numbness/no tingling/no dizziness  I reviewed pt's medications, allergies, PMH, social hx, family hx, and changes were documented in the history of present illness.   PMH: Patient Active Problem List   Diagnosis Date Noted  .  Postsurgical hypothyroidism 12/10/2017    Priority: High  . 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 Hair loss 05/20/2017   Past Surgical History:  Procedure Laterality Date  . COLONOSCOPY    . THYROIDECTOMY N/A 11/10/2017   Procedure: TOTAL THYROIDECTOMY;  Surgeon: Armandina Gemma, MD;  Location: [facility];  Service: General;  Laterality: N/A;  . VARICOSE VEIN SURGERY Bilateral   . WISDOM TOOTH EXTRACTION       Social History   Social History  . Marital status: Married    Spouse name: N/A  . Number of children: 0   Occupational History  . n/a   Social History Main Topics  . Smoking status: Never Smoker  . Smokeless tobacco: Never Used  . Alcohol use occasional  . Drug use: no   Current Outpatient Medications  Medication Sig Dispense Refill  . calcium carbonate (TUMS) 500 MG chewable tablet Chew 2 tablets (400 mg of elemental calcium total) by mouth 2 (two) times daily. 90 tablet 1  . Calcium-Vitamin D-Vitamin K (VIACTIV PO) Take 1 tablet by mouth daily.    Marland Kitchen EVENING PRIMROSE OIL PO Take 1 capsule by mouth See admin instructions. Take 1 capsule by mouth 5 times weekly    . ibuprofen (ADVIL,MOTRIN) 200 MG tablet Take 400 mg by mouth every 6 (six) hours as needed for headache, mild pain or moderate pain.     Marland Kitchen levothyroxine (SYNTHROID) 88 MCG tablet Take 1 tablet (88 mcg total) by mouth daily. 30 tablet 11  . OVER THE COUNTER MEDICATION Take 1 tablet by mouth daily. Nurturlax with Aloe Supplement    . Probiotic Product (PROBIOTIC PO) Take 1 capsule by mouth daily.     No current facility-administered medications for this visit.    No Known Allergies   Family History  Problem Relation Age of Onset  . Hypertension Mother   . Thyroid disease Mother   . Diabetes Father   . Renal Disease Father   . Thyroid disease Sister   . Hypertension Maternal Uncle   . Heart disease Maternal Uncle      PE: BP 130/80   Pulse 88   Ht _0   (1.676 m) Comment: measured  Wt 132 lb (59.9 kg)   SpO2 98%   BMI 21.31 kg/m  Wt Readings from Last 3 Encounters:  12/10/17 132 lb (59.9 kg)  11/10/17 132 lb (59.9 kg)  11/03/17 132 lb 11.2 oz (60.2 kg)   Constitutional: Normal weight, in NAD Eyes: PERRLA, EOMI, no exophthalmos ENT: moist mucous membranes, no neck masses palpated, thyroidectomy scar healing, no cervical lymphadenopathy Cardiovascular: RRR, No MRG Respiratory: CTA B Gastrointestinal: abdomen soft, NT, ND, BS+ Musculoskeletal: no deformities, strength intact in all 4 Skin: moist, warm, no rashes Neurological: no tremor with outstretched hands, DTR normal in all 4  ASSESSMENT: 1.  Papillary thyroid cancer, follicular variant -Family history of thyroid cancer in her sister (recent diagnosis)  2.  Postsurgical hypothyroidism  3. Hair loss  PLAN: 1.PTC -new dx -Patient has  a history of a nontoxic, large goiter, with 3 dominant nodules: One in each lobe and one in the isthmus.  The nodules in the 2 lobes appeared hyperechoic and with increased vascularity.  The left nodule has been biopsied twice with benign results.  The right nodule was biopsied in 2005, however, it appeared smaller on the ultrasound from 2018.  The isthmic nodule, however, was hypoechoic and contained fluid.  This was drained the 01/2015 and at that time the biopsy result was inconclusive due to insufficient sample. She called me at the end of 2018 with neck compression symptoms, but she attributes them to anxiety in the end.  She was feeling swelling in her neck when she turned her head.  At last visit, we decided to rebiopsy the right in the isthmic thyroid nodules.  The right thyroid nodule had an inconclusive biopsy however, the [facility] molecular marker was suspicious.  I referred her back to Dr. Harlow Asa for total thyroidectomy, which she had on 11/10/2017. 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  Also she had no lymphovascular invasion.  Based on these and the fact that she is less than 63 years old, she qualifies as stage I TNM, which gives her a very good prognosis. -We discussed today that for the low risk category of PTC, RAI treatment is not indicated.  She agrees with the plan. -We will continue to follow her by thyroid ultrasound obtained in a year after her surgery, TFTs, and thyroglobulin levels.  We discussed that there is a tumor marker after thyroidectomy and will follow its trend. -It is too soon to check a thyroglobulin now, but will do so at next appointment in 6 months. -Of note, reviewed calcium level after surgery and this was normal.  We will not repeat this today.  She is on calcium once a day, but not quite every day.  2.  Postsurgical hypothyroidism - Started on LT4 88 mcg daily after the surgery.  Of note, the calculated levothyroxine dose required based on her weight is approximately 90 mcg per day - latest thyroid labs reviewed with pt >> normal  - she continues on LT4 88 mcg daily - pt feels good on this dose. - we discussed about taking the thyroid hormone every day, with water, >30 minutes before breakfast, separated by >4 hours from acid reflux medications, calcium, iron, multivitamins. Pt. is taking it correctly. - will check thyroid tests today: TSH and fT4.  We discussed that in the first year after thyroidectomy, we aim for a TSH in the low normal range, and this is acceptable to be slightly under the lower limit of normal. - If labs are abnormal, she will need to return for repeat TFTs in 1.5 months  3. Hair loss - improved  Needs refills.  Office Visit on 12/10/2017  Component Date Value Ref Range Status  . TSH 12/10/2017 1.01  0.35 - 4.50 uIU/mL Final  . Free T4 12/10/2017 1.10  0.60 - 1.60 ng/dL Final   Comment:  Specimens from patients who are undergoing biotin therapy and /or ingesting biotin supplements may contain high levels of biotin.  The higher biotin concentration in these specimens interferes with this Free T4 assay.  Specimens that contain high levels  of biotin may cause false high results for this Free T4  assay.  Please interpret results in light of the total clinical presentation of the patient.     Normal TFTs.  [name], MD PhD [facility]

## 2018-02-16 DIAGNOSIS — N952 Postmenopausal atrophic vaginitis: Secondary | ICD-10-CM | POA: Diagnosis not present

## 2018-02-16 DIAGNOSIS — Z124 Encounter for screening for malignant neoplasm of cervix: Secondary | ICD-10-CM | POA: Diagnosis not present

## 2018-02-16 DIAGNOSIS — Z1151 Encounter for screening for human papillomavirus (HPV): Secondary | ICD-10-CM | POA: Diagnosis not present

## 2018-02-16 DIAGNOSIS — Z01419 Encounter for gynecological examination (general) (routine) without abnormal findings: Secondary | ICD-10-CM | POA: Diagnosis not present

## 2018-02-16 DIAGNOSIS — Z803 Family history of malignant neoplasm of breast: Secondary | ICD-10-CM | POA: Diagnosis not present

## 2018-02-16 DIAGNOSIS — E890 Postprocedural hypothyroidism: Secondary | ICD-10-CM | POA: Diagnosis not present

## 2018-02-20 DIAGNOSIS — L299 Pruritus, unspecified: Secondary | ICD-10-CM | POA: Diagnosis not present

## 2018-02-20 DIAGNOSIS — L309 Dermatitis, unspecified: Secondary | ICD-10-CM | POA: Diagnosis not present

## 2018-03-06 ENCOUNTER — Telehealth: Payer: Self-pay | Admitting: Internal Medicine

## 2018-03-06 MED ORDER — LEVOTHYROXINE SODIUM 88 MCG PO TABS: 88.0000 ug | ORAL_TABLET | Freq: Every day | ORAL | 3 refills | Status: DC

## 2018-03-06 NOTE — Telephone Encounter (Signed)
Patient called to find out if she needs lab work before her Levothyroxine can be refilled.  She does not recall what needs to be done.  If she needs labs she would like to go to a closer offsite lab if possible.  If she does not need labs she needs her Levothyroxine refilled.

## 2018-03-06 NOTE — Telephone Encounter (Signed)
Please advise, last chart note says if abnormal will repeat in 1.5 months, lab results normal but did not mention when due again.

## 2018-03-06 NOTE — Telephone Encounter (Signed)
Okay to refill.  We will repeat the labs when she comes back for next visit.

## 2018-06-11 ENCOUNTER — Other Ambulatory Visit: Payer: Self-pay

## 2018-06-12 ENCOUNTER — Ambulatory Visit (INDEPENDENT_AMBULATORY_CARE_PROVIDER_SITE_OTHER): Payer: BLUE CROSS/BLUE SHIELD | Admitting: Internal Medicine

## 2018-06-12 ENCOUNTER — Other Ambulatory Visit: Payer: Self-pay

## 2018-06-12 ENCOUNTER — Encounter: Payer: Self-pay | Admitting: Internal Medicine

## 2018-06-12 VITALS — BP 110/60 | HR 67 | Ht 66.0 in | Wt 132.0 lb

## 2018-06-12 DIAGNOSIS — C73 Malignant neoplasm of thyroid gland: Secondary | ICD-10-CM | POA: Diagnosis not present

## 2018-06-12 DIAGNOSIS — E890 Postprocedural hypothyroidism: Secondary | ICD-10-CM

## 2018-06-12 LAB — T4, FREE: Free T4: 1.3 ng/dL (ref 0.60–1.60)

## 2018-06-12 LAB — TSH: TSH: 0.52 u[IU]/mL (ref 0.35–4.50)

## 2018-06-12 NOTE — Progress Notes (Signed)
Patient ID: Jeanne Roberts, female   DOB: 05-02-1965, 53 y.o.   MRN: [id number]   HPI  Jeanne Roberts is a 53 y.o.-year-old female, initially referred by Dr Harlow Asa for thyroid nodules, now with a diagnosis of thyroid cancer and post surgical hypothyroidism. She previously saw several endocrinologists, last: Dr. Meredith Pel.  Last visit with me 6 months ago.   PCP: Dr Theodis Sato  Reviewed and addended history:  She was dx'ed with thyroid nodules in 2005, after a thyroid nodule was found by palpation.  Thyroid nodule Bx (05/24/2003):  - Left: benign - Right: benign  Thyroid U/S (11/17/2014): Right thyroid lobe: 64 x 30 x 31 mm. 46 x 27 x 37 mm complex mostly solid mass, inferior pole. Left thyroid lobe: 65 x 38 x 39 mm. Somewhat lobular 53 x 26 x 42 mm solid hyperemic mass, inferior pole. Isthmus Thickness: 6 mm.24 x 17 x 20 mm complex cyst, right isthmus Lymphadenopathy: None visualized. Bilateral calcified carotid plaque is incidentally noted, without assessment for degree of stenosis. IMPRESSION: 1. Thyromegaly with right, left, and isthmic nodules which all meet consensus criteria for biopsy.  Thyroid nodule Bx (01/31/2015): - isthmus nodule: scant follicular epithelium: bethesda categ 1 - L thyroid nodule: benign  Thyroid U/S (07/10/2015): Right thyroid lobe: 7.2 x 3.2 x 3.6 cm. Heterogeneous lower pole nodule measured 2.8 x 4.9 x 2.5 cm and previously measured 4.6 x 2.7 x 3.7 cm. Subcentimeter right upper pole nodules are noted. Left thyroid lobe: 7.3 x 3.4 x 4.5 cm. Solid heterogeneous lower pole nodule measures 4.4 x 5.2 x 2.9 cm and previously measured up to 5.3 cm. Subcentimeter left upper pole nodules are noted. Isthmus Thickness: 0.8 cm. Hypoechoic nodule measures 1.9 x 1.4 x 2.1 cm and previously measured up to 2.4 cm. Lymphadenopathy: None visualized.  IMPRESSION: Bilateral and isthmic nodules are not significantly changed as described above. The largest is on the left and  measures up to 5.2 cm.  Thyroid U/S (11/08/2016):  Parenchymal Echotexture: Moderately heterogenous Right lobe: 7 x 2.7 x 3.3 cm, previously 7.2 x 3.2 x 3.6.  Right inferior nodule: 4.2 x 2 x 3.1 cm, previously 4.9 x 2.5 x 2.8 cm, mixed cystic and solid, hypoechoic.  Not previously biopsied  Left lobe: 7 x 3.1 x 4 cm, previously 7.3 x 3.4 x 4.5.  Lobular mid left nodule: 5.7 x 2.5 x 4.8 cm, previously 5.2 x 2.9 x 4.4; this was previously biopsied.  Isthmus: 0.9 cm thickness, previously 0.8 cm.  Very hypoechoic isthmic nodule: 1.5 x 0.8 x 1 cm, previously 2.1 x1.4 x 1.9; this was previously biopsied but with scant material.  IMPRESSION: 1. Thyromegaly with little change in bilateral and isthmic nodules. 2. Recommend FNA biopsy of mildly suspicious 4.2 cm inferior right nodule.  Plan was:  Notes recorded by [name], MD on 11/13/2016 at 12:19 PM EDT .Marland KitchenMarland KitchenHer left thyroid nodule is slightly larger, with the largest dimension increasing from 5.2 cm to 5.7 cm. This nodule was biopsied twice, including last year, and is benign. However, per my understanding, she has more neck compression symptoms and I am curious whether she is interested in thyroidectomy.  The other 2 nodules are slightly smaller, which is very good news: The isthmic nodule has been previously biopsied but the biopsy was inconclusive. If she does not desire thyroidectomy, we will need to rebiopsy this nodule and most likely also the right thyroid nodule, since this is still large, although, again, decreased in size slightly since last  check.  She continued to have neck compression symptoms >> We decided to rebiopsy the right and the isthmic thyroid nodules:  THYROID, FINE NEEDLE ASPIRATION ISTHMUS (SPECIMEN 1 OF 2, COLLECTED ON 09/21/4780): SCANT FOLLICULAR EPITHELIUM PRESENT (BETHESDA CATEGORY I). [name] MD Pathologist, Electronic Signature (Case signed 08/22/2017) Specimen Clinical Information 1.5 x 0.8 x 1cm  very hypoechoic isthmic nodule, previously 2.1 x 1.4 x 1.9; this was previoulsy biopsied, Previous biopsy 1/10/17[name] Thyroid, Fine Needle Aspiration, Isthmus, (Specimen 1 of 2, collected on 08/21/2017)  Adequacy Reason Satisfactory For Evaluation. Diagnosis THYROID, FINE NEEDLE ASPIRATION RIGHT (SPECIMEN 2 OF 2, COLLECTED ON 08/21/2017): ATYPIA OF UNDETERMINED SIGNIFICANCE OR FOLLICULAR LESION OF UNDETERMINED SIGNIFICANCE (BETHESDA CATEGORY III). COMMENT: THIS SPECIMEN WILL BE SENT FOR AFIRMA TESTING. [name] MD Pathologist, Electronic Signature (Case signed 08/22/2017) Specimen Clinical Information Right inferior 4.2cm; Other 2 dimensions: 2 x 3cm, previously 4.9 x 2.5 x 2.8cm, Mixed cystic and solid, Hypoechoic, TI-RADS total points 3   AFIRMA molecular testing performed on the right thyroid nodule returned suspicious for cancer.   Patient had total thyroidectomy 11/10/2017:  Diagnosis Thyroid, thyroidectomy - PAPILLARY THYROID CARCINOMA, FOLLICULAR VARIANT, 4 CM. - TUMOR CONFINED WITHIN THYROID CAPSULE. - MARGINS NOT INVOLVED. - SEE ONCOLOGY TABLE.  THYROID GLAND: Procedure: Thyroidectomy. Tumor Focality: Unifocal. Tumor Site: Right lobe. Tumor Size: 4 x 3.2 x 2.4 cm. Histologic Type: Follicular variant of the papillary thyroid carcinoma. Margins: Free of tumor. Angioinvasion: No. Lymphatic Invasion: No. Extrathyroidal: No. Regional Lymph Nodes: No lymph nodes submitted or found. Pathologic Stage Classification (pTNM, AJCC 8th Edition): pT2, pNx Representative tumor block: 1D. Comment(s): The nodule in the right lobe has scattered foci with nuclear cytologic features consistent with papillary thyroid carcinoma, follicular variant. There is a 6.2 cm nodule in the left lobe which has follicular architecture and scattered foci of cytologic atypia and is consistent with a follicular neoplasm without capsular or vascular invasion. There is a smaller 0.9  cm nodule in the isthmus with features of a colloid nodule. (JDP:ah 11/11/17)  No results found for: THYROGLB  No results found for: THGAB   Pt denies: - feeling nodules in neck - hoarseness - dysphagia - choking - SOB with lying down  Pt is on levothyroxine 88 mcg daily, taken: - in am, with water - drinks black coffee - fasting - at least 30 min from b'fast - no Fe, MVI, PPIs - + occasional calcium later - not on Biotin - + collagen 3-4 hrs later  Reviewed patient's TFTs: Lab Results  Component Value Date   TSH 1.01 12/10/2017   TSH 0.85 05/20/2017   FREET4 1.10 12/10/2017   FREET4 0.80 05/20/2017  03/26/2016: TSH 1.62, free T4 1.19, free T3 3.53  03/20/2015: TSH 0.84, free T4 0.83, free T3 2.9  + FH of thyroid ds in mother, who had to have her thyroid removed (not cancer).  1 of her sisters recently diagnosed with thyroid cancer.  No FH of thyroid cancer. No h/o radiation tx to head or neck.  No herbal supplements. No Biotin use. No recent steroids use.   At last visit, she was feeling well after the surgery and restarted running >> still running. Also, hair loss resolved.  ROS: Constitutional: no weight gain/no weight loss, + fatigue, no subjective hyperthermia, no subjective hypothermia Eyes: no blurry vision, no xerophthalmia ENT: no sore throat, + see HPI Cardiovascular: no CP/no SOB/no palpitations/no leg swelling Respiratory: no cough/no SOB/no wheezing Gastrointestinal: no N/no V/no D/no C/no  acid reflux Musculoskeletal: no muscle aches/no joint aches Skin: no rashes, no hair loss Neurological: no tremors/no numbness/no tingling/no dizziness  I reviewed pt's medications, allergies, PMH, social hx, family hx, and changes were documented in the history of present illness. Otherwise, unchanged from my initial visit note.  PMH: Patient Active Problem List   Diagnosis Date Noted  . Postsurgical hypothyroidism 12/10/2017    Priority: High  . Papillary  thyroid carcinoma ([facility]) - follicular variant [id number]    Priority: High  . Hair loss 05/20/2017   Past Surgical History:  Procedure Laterality Date  . COLONOSCOPY    . THYROIDECTOMY N/A 11/10/2017   Procedure: TOTAL THYROIDECTOMY;  Surgeon: Armandina Gemma, MD;  Location: [facility];  Service: General;  Laterality: N/A;  . VARICOSE VEIN SURGERY Bilateral   . WISDOM TOOTH EXTRACTION       Social History   Social History  . Marital status: Married    Spouse name: N/A  . Number of children: 0   Occupational History  . n/a   Social History Main Topics  . Smoking status: Never Smoker  . Smokeless tobacco: Never Used  . Alcohol use occasional  . Drug use: no   Current Outpatient Medications  Medication Sig Dispense Refill  . calcium carbonate (TUMS) 500 MG chewable tablet Chew 2 tablets (400 mg of elemental calcium total) by mouth 2 (two) times daily. 90 tablet 1  . Calcium-Vitamin D-Vitamin K (VIACTIV PO) Take 1 tablet by mouth daily.    Marland Kitchen EVENING PRIMROSE OIL PO Take 1 capsule by mouth See admin instructions. Take 1 capsule by mouth 5 times weekly    . ibuprofen (ADVIL,MOTRIN) 200 MG tablet Take 400 mg by mouth every 6 (six) hours as needed for headache, mild pain or moderate pain.     Marland Kitchen levothyroxine (SYNTHROID) 88 MCG tablet Take 1 tablet (88 mcg total) by mouth daily. 90 tablet 3  . OVER THE COUNTER MEDICATION Take 1 tablet by mouth daily. Nurturlax with Aloe Supplement    . Probiotic Product (PROBIOTIC PO) Take 1 capsule by mouth daily.     No current facility-administered medications for this visit.    No Known Allergies   Family History  Problem Relation Age of Onset  . Hypertension Mother   . Thyroid disease Mother   . Diabetes Father   . Renal Disease Father   . Thyroid disease Sister   . Hypertension Maternal Uncle   . Heart disease Maternal Uncle      PE: BP 110/60   Pulse 67   Ht '5\' 6"'$  (1.676 m)   Wt 132 lb (59.9 kg)   SpO2 97%   BMI 21.31 kg/m  Wt  Readings from Last 3 Encounters:  06/12/18 132 lb (59.9 kg)  12/10/17 132 lb (59.9 kg)  11/10/17 132 lb (59.9 kg)   Constitutional: Normal weight, in NAD Eyes: PERRLA, EOMI, no exophthalmos ENT: moist mucous membranes, no neck masses palpated, thyroidectomy scar healed, no cervical lymphadenopathy Cardiovascular: RRR, No MRG Respiratory: CTA B Gastrointestinal: abdomen soft, NT, ND, BS+ Musculoskeletal: no deformities, strength intact in all 4 Skin: moist, warm, no rashes Neurological: no tremor with outstretched hands, DTR normal in all 4  ASSESSMENT: 1.  Papillary thyroid cancer, follicular variant -Family history of thyroid cancer in her sister (recent diagnosis)  2.  Postsurgical hypothyroidism  3. Hair loss  PLAN: 1.PTC -new dx -Patient has a history of nontoxic, large, goiter, with 3 dominant nodules.  An isthmic thyroid nodule was  biopsied with inconclusive results but the [facility] molecular marker was suspicious.  She had total thyroidectomy in 10/2017 -Dr. Harlow Asa.  She had a follicular variant PTC, a 4 cm -therefore, she has a stage II TNM pathology.  The cancer was confined to the thyroid capsule and margins were not involved.  Also, she did not have lymphovascular invasion.  Based on these and the fact that she was less than 56 years old, she qualified as stage I TNM clinically, which is her very good prognosis.  Therefore, RAI was not indicated. -We discussed that we will continue to follow her with ultrasounds and also thyroglobulin. -We plan to check a neck ultrasound a year after the surgery, which would be in approximately 5  Months. -For now, I plan to check a thyroglobulin and ATA antibodies.  I explained that the absolute value is not as important as the trend in thyroglobulin.  2.  Postsurgical hypothyroidism -Developed after her thyroid cancer surgery - latest thyroid labs reviewed with pt >> normal 11/2017 - she continues on LT4 88 mcg daily - pt feels good on  this dose, with just some fatigue - we discussed about taking the thyroid hormone every day, with water, >30 minutes before breakfast, separated by >4 hours from acid reflux medications, calcium, iron, multivitamins. Pt. is taking it correctly. - will check thyroid tests today: TSH and fT4 - If labs are abnormal, she will need to return for repeat TFTs in 1.5 months  3. Hair loss - resolved - Discussed that, if this appears again, she may take a B complex but she needs to remember to stop the medication 2 days prior to thyroid labs due to biotin interference.  Component     Latest Ref Rng & Units 06/12/2018  Thyroglobulin     ng/mL 0.2 (L)  Comment        TSH     0.35 - 4.50 uIU/mL 0.52  T4,Free(Direct)     0.60 - 1.60 ng/dL 1.30  Thyroglobulin Ab     < or = 1 IU/mL <1   Labs are at goal.  [name], MD PhD [facility] Endocrinology

## 2018-06-12 NOTE — Patient Instructions (Signed)
Please continue: - Levothyroxine 88 mcg daily.  Take the thyroid hormone every day, with water, at least 30 minutes before breakfast, separated by at least 4 hours from: - acid reflux medications - calcium - iron - multivitamins  Please return in 6 months.

## 2018-06-16 LAB — THYROGLOBULIN LEVEL: Thyroglobulin: 0.2 ng/mL — ABNORMAL LOW

## 2018-06-16 LAB — THYROGLOBULIN ANTIBODY: Thyroglobulin Ab: <1 IU/mL (ref <=1)

## 2018-07-02 DIAGNOSIS — Z20828 Contact with and (suspected) exposure to other viral communicable diseases: Secondary | ICD-10-CM | POA: Diagnosis not present

## 2018-11-02 ENCOUNTER — Telehealth: Payer: Self-pay

## 2018-11-02 DIAGNOSIS — E890 Postprocedural hypothyroidism: Secondary | ICD-10-CM

## 2018-11-02 DIAGNOSIS — C73 Malignant neoplasm of thyroid gland: Secondary | ICD-10-CM

## 2018-11-02 NOTE — Telephone Encounter (Signed)
No need to check her thyroglobulin and ATA antibodies.  We can send her an order for a TSH and free T4.  She has to remember to be off biotin for at least 2 days before the test.

## 2018-11-02 NOTE — Telephone Encounter (Signed)
Please advise.  I will have to mail the orders to her.

## 2018-11-02 NOTE — Telephone Encounter (Signed)
Patient called in wanting to know if Dr will be ordering labs at her visit on 11/23/2018, if so she would like to get her labs done in [place] piror to her appt so Dr can read them while she's here. Also patient wants to know if she had two type of cells in her blood work populary, and flickerly ( not sure of spelling)   Please advise   Patient says cells is best contact number

## 2018-11-04 NOTE — Telephone Encounter (Signed)
Labs mailed to patient.

## 2018-11-23 ENCOUNTER — Other Ambulatory Visit: Payer: Self-pay

## 2018-11-23 ENCOUNTER — Encounter: Payer: Self-pay | Admitting: Internal Medicine

## 2018-11-23 ENCOUNTER — Ambulatory Visit (INDEPENDENT_AMBULATORY_CARE_PROVIDER_SITE_OTHER): Payer: BC Managed Care – PPO | Admitting: Internal Medicine

## 2018-11-23 VITALS — BP 120/70 | HR 78 | Ht 66.0 in | Wt 135.0 lb

## 2018-11-23 DIAGNOSIS — W540XXA Bitten by dog, initial encounter: Secondary | ICD-10-CM | POA: Diagnosis not present

## 2018-11-23 DIAGNOSIS — E890 Postprocedural hypothyroidism: Secondary | ICD-10-CM | POA: Diagnosis not present

## 2018-11-23 DIAGNOSIS — S81851A Open bite, right lower leg, initial encounter: Secondary | ICD-10-CM | POA: Diagnosis not present

## 2018-11-23 DIAGNOSIS — C73 Malignant neoplasm of thyroid gland: Secondary | ICD-10-CM

## 2018-11-23 LAB — T4, FREE: Free T4: 1.28 ng/dL (ref 0.60–1.60)

## 2018-11-23 LAB — TSH: TSH: 0.44 u[IU]/mL (ref 0.35–4.50)

## 2018-11-23 NOTE — Patient Instructions (Addendum)
Please continue: - Levothyroxine 88 mcg daily.  Take the thyroid hormone every day, with water, at least 30 minutes before breakfast, separated by at least 4 hours from: - acid reflux medications - calcium - iron - multivitamins  We will schedule a neck ultrasound for you.  Please let me know if you are not called to schedule this within 1 week.  Please return in 1 year.

## 2018-11-23 NOTE — Progress Notes (Signed)
Patient ID: Jeanne Roberts, female   DOB: December 17, 1965, 53 y.o.   MRN: [id number]   HPI  Jeanne Roberts is a 53 y.o.-year-old female, initially referred by Dr Harlow Asa for thyroid nodules, now with a diagnosis of thyroid cancer and post surgical hypothyroidism. She previously saw several endocrinologists, last: Dr. Meredith Pel.  Last visit with me 5 months ago. PCP: Dr Theodis Sato  She was bitten by a dog yesterday.  The dog had all the necessary vaccinations.  However, she is not up-to-date with her Tdap.  Reviewed for thyroid cancer history:  She was dx'ed with thyroid nodules in 2005, after a thyroid nodule was found by palpation.  Thyroid nodule Bx (05/24/2003):  - Left: benign - Right: benign  Thyroid U/S (11/17/2014): Right thyroid lobe: 64 x 30 x 31 mm. 46 x 27 x 37 mm complex mostly solid mass, inferior pole. Left thyroid lobe: 65 x 38 x 39 mm. Somewhat lobular 53 x 26 x 42 mm solid hyperemic mass, inferior pole. Isthmus Thickness: 6 mm.24 x 17 x 20 mm complex cyst, right isthmus Lymphadenopathy: None visualized. Bilateral calcified carotid plaque is incidentally noted, without assessment for degree of stenosis. IMPRESSION: 1. Thyromegaly with right, left, and isthmic nodules which all meet consensus criteria for biopsy.  Thyroid nodule Bx (01/31/2015): - isthmus nodule: scant follicular epithelium: bethesda categ 1 - L thyroid nodule: benign  Thyroid U/S (07/10/2015): Right thyroid lobe: 7.2 x 3.2 x 3.6 cm. Heterogeneous lower pole nodule measured 2.8 x 4.9 x 2.5 cm and previously measured 4.6 x 2.7 x 3.7 cm. Subcentimeter right upper pole nodules are noted. Left thyroid lobe: 7.3 x 3.4 x 4.5 cm. Solid heterogeneous lower pole nodule measures 4.4 x 5.2 x 2.9 cm and previously measured up to 5.3 cm. Subcentimeter left upper pole nodules are noted. Isthmus Thickness: 0.8 cm. Hypoechoic nodule measures 1.9 x 1.4 x 2.1 cm and previously measured up to 2.4 cm. Lymphadenopathy: None  visualized.  IMPRESSION: Bilateral and isthmic nodules are not significantly changed as described above. The largest is on the left and measures up to 5.2 cm.  Thyroid U/S (11/08/2016):  Parenchymal Echotexture: Moderately heterogenous Right lobe: 7 x 2.7 x 3.3 cm, previously 7.2 x 3.2 x 3.6.  Right inferior nodule: 4.2 x 2 x 3.1 cm, previously 4.9 x 2.5 x 2.8 cm, mixed cystic and solid, hypoechoic.  Not previously biopsied  Left lobe: 7 x 3.1 x 4 cm, previously 7.3 x 3.4 x 4.5.  Lobular mid left nodule: 5.7 x 2.5 x 4.8 cm, previously 5.2 x 2.9 x 4.4; this was previously biopsied.  Isthmus: 0.9 cm thickness, previously 0.8 cm.  Very hypoechoic isthmic nodule: 1.5 x 0.8 x 1 cm, previously 2.1 x1.4 x 1.9; this was previously biopsied but with scant material.  IMPRESSION: 1. Thyromegaly with little change in bilateral and isthmic nodules. 2. Recommend FNA biopsy of mildly suspicious 4.2 cm inferior right nodule.  Plan was:  Notes recorded by [name], MD on 11/13/2016 at 12:19 PM EDT .Marland KitchenMarland KitchenHer left thyroid nodule is slightly larger, with the largest dimension increasing from 5.2 cm to 5.7 cm. This nodule was biopsied twice, including last year, and is benign. However, per my understanding, she has more neck compression symptoms and I am curious whether she is interested in thyroidectomy.  The other 2 nodules are slightly smaller, which is very good news: The isthmic nodule has been previously biopsied but the biopsy was inconclusive. If she does not desire thyroidectomy, we will need to  rebiopsy this nodule and most likely also the right thyroid nodule, since this is still large, although, again, decreased in size slightly since last check.  She continued to have neck compression symptoms >> We decided to rebiopsy the right and the isthmic thyroid nodules:  THYROID, FINE NEEDLE ASPIRATION ISTHMUS (SPECIMEN 1 OF 2, COLLECTED ON 0/0/9381): SCANT FOLLICULAR EPITHELIUM PRESENT (BETHESDA  CATEGORY I). [name] MD Pathologist, Electronic Signature (Case signed 08/22/2017) Specimen Clinical Information 1.5 x 0.8 x 1cm very hypoechoic isthmic nodule, previously 2.1 x 1.4 x 1.9; this was previoulsy biopsied, Previous biopsy 1/10/17[name] [facility] Thyroid, Fine Needle Aspiration, Isthmus, (Specimen 1 of 2, collected on 08/21/2017)  Adequacy Reason Satisfactory For Evaluation. Diagnosis THYROID, FINE NEEDLE ASPIRATION RIGHT (SPECIMEN 2 OF 2, COLLECTED ON 08/21/2017): ATYPIA OF UNDETERMINED SIGNIFICANCE OR FOLLICULAR LESION OF UNDETERMINED SIGNIFICANCE (BETHESDA CATEGORY III). COMMENT: THIS SPECIMEN WILL BE SENT FOR AFIRMA TESTING. [name] MD Pathologist, Electronic Signature (Case signed 08/22/2017) Specimen Clinical Information Right inferior 4.2cm; Other 2 dimensions: 2 x 3cm, previously 4.9 x 2.5 x 2.8cm, Mixed cystic and solid, Hypoechoic, TI-RADS total points 3   AFIRMA molecular testing performed on the right thyroid nodule returned suspicious for cancer.   Patient had total thyroidectomy 11/10/2017:  Diagnosis Thyroid, thyroidectomy - PAPILLARY THYROID CARCINOMA, FOLLICULAR VARIANT, 4 CM. - TUMOR CONFINED WITHIN THYROID CAPSULE. - MARGINS NOT INVOLVED. - SEE ONCOLOGY TABLE.  THYROID GLAND: Procedure: Thyroidectomy. Tumor Focality: Unifocal. Tumor Site: Right lobe. Tumor Size: 4 x 3.2 x 2.4 cm. Histologic Type: Follicular variant of the papillary thyroid carcinoma. Margins: Free of tumor. Angioinvasion: No. Lymphatic Invasion: No. Extrathyroidal: No. Regional Lymph Nodes: No lymph nodes submitted or found. Pathologic Stage Classification (pTNM, AJCC 8th Edition): pT2, pNx Representative tumor block: 1D. Comment(s): The nodule in the right lobe has scattered foci with nuclear cytologic features consistent with papillary thyroid carcinoma, follicular variant. There is a 6.2 cm nodule in the left lobe which has follicular architecture  and scattered foci of cytologic atypia and is consistent with a follicular neoplasm without capsular or vascular invasion. There is a smaller 0.9 cm nodule in the isthmus with features of a colloid nodule. (JDP:ah 11/11/17)  Lab Results  Component Value Date   THYROGLB 0.2 (L) 06/12/2018    Lab Results  Component Value Date   THGAB <1 06/12/2018    Pt denies: - feeling nodules in neck - hoarseness - dysphagia - choking - SOB with lying down  Pt is on levothyroxine 88 mcg daily, taken: - in am, with water - Drinks black coffee - fasting - at least 30 min from b'fast - no Ca, Fe, MVI, PPIs - not on Biotin - on collagen 2-3 hrs after LT4  Reviewed her TFTs: Lab Results  Component Value Date   TSH 0.52 06/12/2018   TSH 1.01 12/10/2017   TSH 0.85 05/20/2017   FREET4 1.30 06/12/2018   FREET4 1.10 12/10/2017   FREET4 0.80 05/20/2017  03/26/2016: TSH 1.62, free T4 1.19, free T3 3.53  03/20/2015: TSH 0.84, free T4 0.83, free T3 2.9  + FH of thyroid ds in mother, who had to have her thyroid removed (not cancer).  One of her sisters was also diagnosed with thyroid cancer. No FH of thyroid cancer. No h/o radiation tx to head or neck.  No herbal supplements. No Biotin use. No recent steroids use.   After the surgery, she recovered and restarted running.  She also had hair loss, which resolved afterwards.  ROS: Constitutional:  no weight gain/no weight loss, no fatigue, no subjective hyperthermia, no subjective hypothermia Eyes: no blurry vision, no xerophthalmia ENT: no sore throat, + see HPI Cardiovascular: no CP/no SOB/no palpitations/no leg swelling Respiratory: no cough/no SOB/no wheezing Gastrointestinal: no N/no V/no D/no C/no acid reflux Musculoskeletal: no muscle aches/no joint aches Skin: no rashes, no hair loss Neurological: no tremors/no numbness/no tingling/no dizziness  I reviewed pt's medications, allergies, PMH, social hx, family hx, and changes were  documented in the history of present illness. Otherwise, unchanged from my initial visit note.  PMH: Patient Active Problem List   Diagnosis Date Noted  . Postsurgical hypothyroidism 12/10/2017    Priority: High  . 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 Hair loss 05/20/2017   Past Surgical History:  Procedure Laterality Date  . COLONOSCOPY    . THYROIDECTOMY N/A 11/10/2017   Procedure: TOTAL THYROIDECTOMY;  Surgeon: Armandina Gemma, MD;  Location: [facility];  Service: General;  Laterality: N/A;  . VARICOSE VEIN SURGERY Bilateral   . WISDOM TOOTH EXTRACTION       Social History   Social History  . Marital status: Married    Spouse name: N/A  . Number of children: 0   Occupational History  . n/a   Social History Main Topics  . Smoking status: Never Smoker  . Smokeless tobacco: Never Used  . Alcohol use occasional  . Drug use: no   Current Outpatient Medications  Medication Sig Dispense Refill  . calcium carbonate (TUMS) 500 MG chewable tablet Chew 2 tablets (400 mg of elemental calcium total) by mouth 2 (two) times daily. 90 tablet 1  . Calcium-Vitamin D-Vitamin K (VIACTIV PO) Take 1 tablet by mouth daily.    Marland Kitchen EVENING PRIMROSE OIL PO Take 1 capsule by mouth See admin instructions. Take 1 capsule by mouth 5 times weekly    . ibuprofen (ADVIL,MOTRIN) 200 MG tablet Take 400 mg by mouth every 6 (six) hours as needed for headache, mild pain or moderate pain.     Marland Kitchen levothyroxine (SYNTHROID) 88 MCG tablet Take 1 tablet (88 mcg total) by mouth daily. 90 tablet 3  . OVER THE COUNTER MEDICATION Take 1 tablet by mouth daily. Nurturlax with Aloe Supplement    . Probiotic Product (PROBIOTIC PO) Take 1 capsule by mouth daily.     No current facility-administered medications for this visit.    No Known Allergies   Family History  Problem Relation Age of Onset  . Hypertension Mother   . Thyroid disease Mother   . Diabetes Father   . Renal  Disease Father   . Thyroid disease Sister   . Hypertension Maternal Uncle   . Heart disease Maternal Uncle      PE: BP 120/70   Pulse 78   Ht '5\' 6"'$  (1.676 m)   Wt 135 lb (61.2 kg)   SpO2 96%   BMI 21.79 kg/m  Wt Readings from Last 3 Encounters:  11/23/18 135 lb (61.2 kg)  06/12/18 132 lb (59.9 kg)  12/10/17 132 lb (59.9 kg)   Constitutional: Normal weight, in NAD Eyes: PERRLA, EOMI, no exophthalmos ENT: moist mucous membranes, no neck masses palpated, thyroidectomy scar healed, no cervical lymphadenopathy Cardiovascular: RRR, No MRG Respiratory: CTA B Gastrointestinal: abdomen soft, NT, ND, BS+ Musculoskeletal: no deformities, strength intact in all 4 Skin: moist, warm, no rashes, R posterior lower leg site of dog bite - The skin appears purple around the site and is  painful to palpation at the site of the ecchymosis. Neurological: no tremor with outstretched hands, DTR normal in all 4  ASSESSMENT: 1.  Papillary thyroid cancer, follicular variant -Family history of thyroid cancer in her sister (recent diagnosis)  2.  Postsurgical hypothyroidism  3.  Dog bite  PLAN: 1.PTC -Follicular variant -Patient has a history of nontoxic goiter, with 3 dominant nodules.  The isthmic thyroid nodule was biopsied with inconclusive results but the Afirma molecular marker was suspicious.  She had total thyroidectomy in 10/2017 by Dr. Harlow Asa.  Final diagnosis was follicular variant of papillary thyroid cancer, up to 4 cm (stage II TNM path).  The cancer was confined to the thyroid capsule and margins were not involved.  She did not have lymphovascular invasion.  Based on these and the fact that she was less than 74 years old at the time of diagnosis, she qualified as a stage I TNM clinically, which has a very good prognosis.  Therefore, we did not proceed with RAI remnant ablation. -We are following her with thyroglobulin and thyroglobulin antibodies. -At last visit, she had undetectable ATA's  and a very low thyroglobulin -We will repeat these now -At this visit, we need a neck ultrasound, now 1 year after her surgery.  I explained the reason why we should check this. -RTC in 1 year  2.  Postsurgical hypothyroidism -Developed after her thyroid cancer - latest thyroid labs reviewed with pt >> normal 05/2018 - she continues on LT4 88 mcg daily - pt feels good on this dose. - we discussed about taking the thyroid hormone every day, with water, >30 minutes before breakfast, separated by >4 hours from acid reflux medications, calcium, iron, multivitamins. Pt. is taking it correctly. - will check thyroid tests today: TSH and fT4 - If labs are abnormal, she will need to return for repeat TFTs in 1.5 months  3. Dog bite -This broke the skin and she has this covered with a Band-Aid.  The skin appears purple around the site and is painful to palpation at the site of the ecchymosis.  She refuses to get a Tdap and prefers to watch the wound for now.  I strongly advised her to go to urgent care to have it evaluated.  Component     Latest Ref Rng & Units 11/23/2018  Thyroglobulin     ng/mL 0.3 (L)  TSH     0.35 - 4.50 uIU/mL 0.44  T4,Free(Direct)     0.60 - 1.60 ng/dL 1.28  Thyroglobulin Ab     < or = 1 IU/mL <1   Normal TFTs.  ATA undetectable.  Thyroglobulin slightly higher but still very low.  We will continue with the plan to obtain a neck ultrasound.  [name], MD PhD [facility]

## 2018-11-24 LAB — THYROGLOBULIN ANTIBODY: Thyroglobulin Ab: <1 IU/mL (ref <=1)

## 2018-11-24 LAB — THYROGLOBULIN LEVEL: Thyroglobulin: 0.3 ng/mL — ABNORMAL LOW

## 2018-11-30 ENCOUNTER — Telehealth: Payer: Self-pay

## 2018-11-30 NOTE — Telephone Encounter (Signed)
Patient called in stating she was told to call in a week if had not been scheduled for ultrasound   Please advise

## 2018-12-01 DIAGNOSIS — Z1231 Encounter for screening mammogram for malignant neoplasm of breast: Secondary | ICD-10-CM | POA: Diagnosis not present

## 2018-12-18 ENCOUNTER — Ambulatory Visit: Payer: BLUE CROSS/BLUE SHIELD | Admitting: Internal Medicine

## 2018-12-21 ENCOUNTER — Ambulatory Visit
Admission: RE | Admit: 2018-12-21 | Discharge: 2018-12-21 | Disposition: A | Discharge status: Home / Self Care | Payer: BC Managed Care – PPO | Source: Ambulatory Visit | Attending: Internal Medicine | Admitting: Internal Medicine

## 2018-12-21 DIAGNOSIS — C73 Malignant neoplasm of thyroid gland: Secondary | ICD-10-CM

## 2018-12-21 DIAGNOSIS — Z85850 Personal history of malignant neoplasm of thyroid: Secondary | ICD-10-CM | POA: Diagnosis not present

## 2018-12-24 ENCOUNTER — Encounter: Payer: Self-pay | Admitting: Internal Medicine

## 2019-03-01 ENCOUNTER — Other Ambulatory Visit: Payer: Self-pay | Admitting: Internal Medicine

## 2019-04-08 DIAGNOSIS — L237 Allergic contact dermatitis due to plants, except food: Secondary | ICD-10-CM | POA: Diagnosis not present

## 2019-04-19 IMAGING — CR DG CHEST 2V
2 series · 2 of 2 positions shown · non-contrast
Comparison: None.

CLINICAL DATA: Preoperative examination prior total thyroidectomy
next week

EXAM:
CHEST - 2 VIEW

[w chest pa]
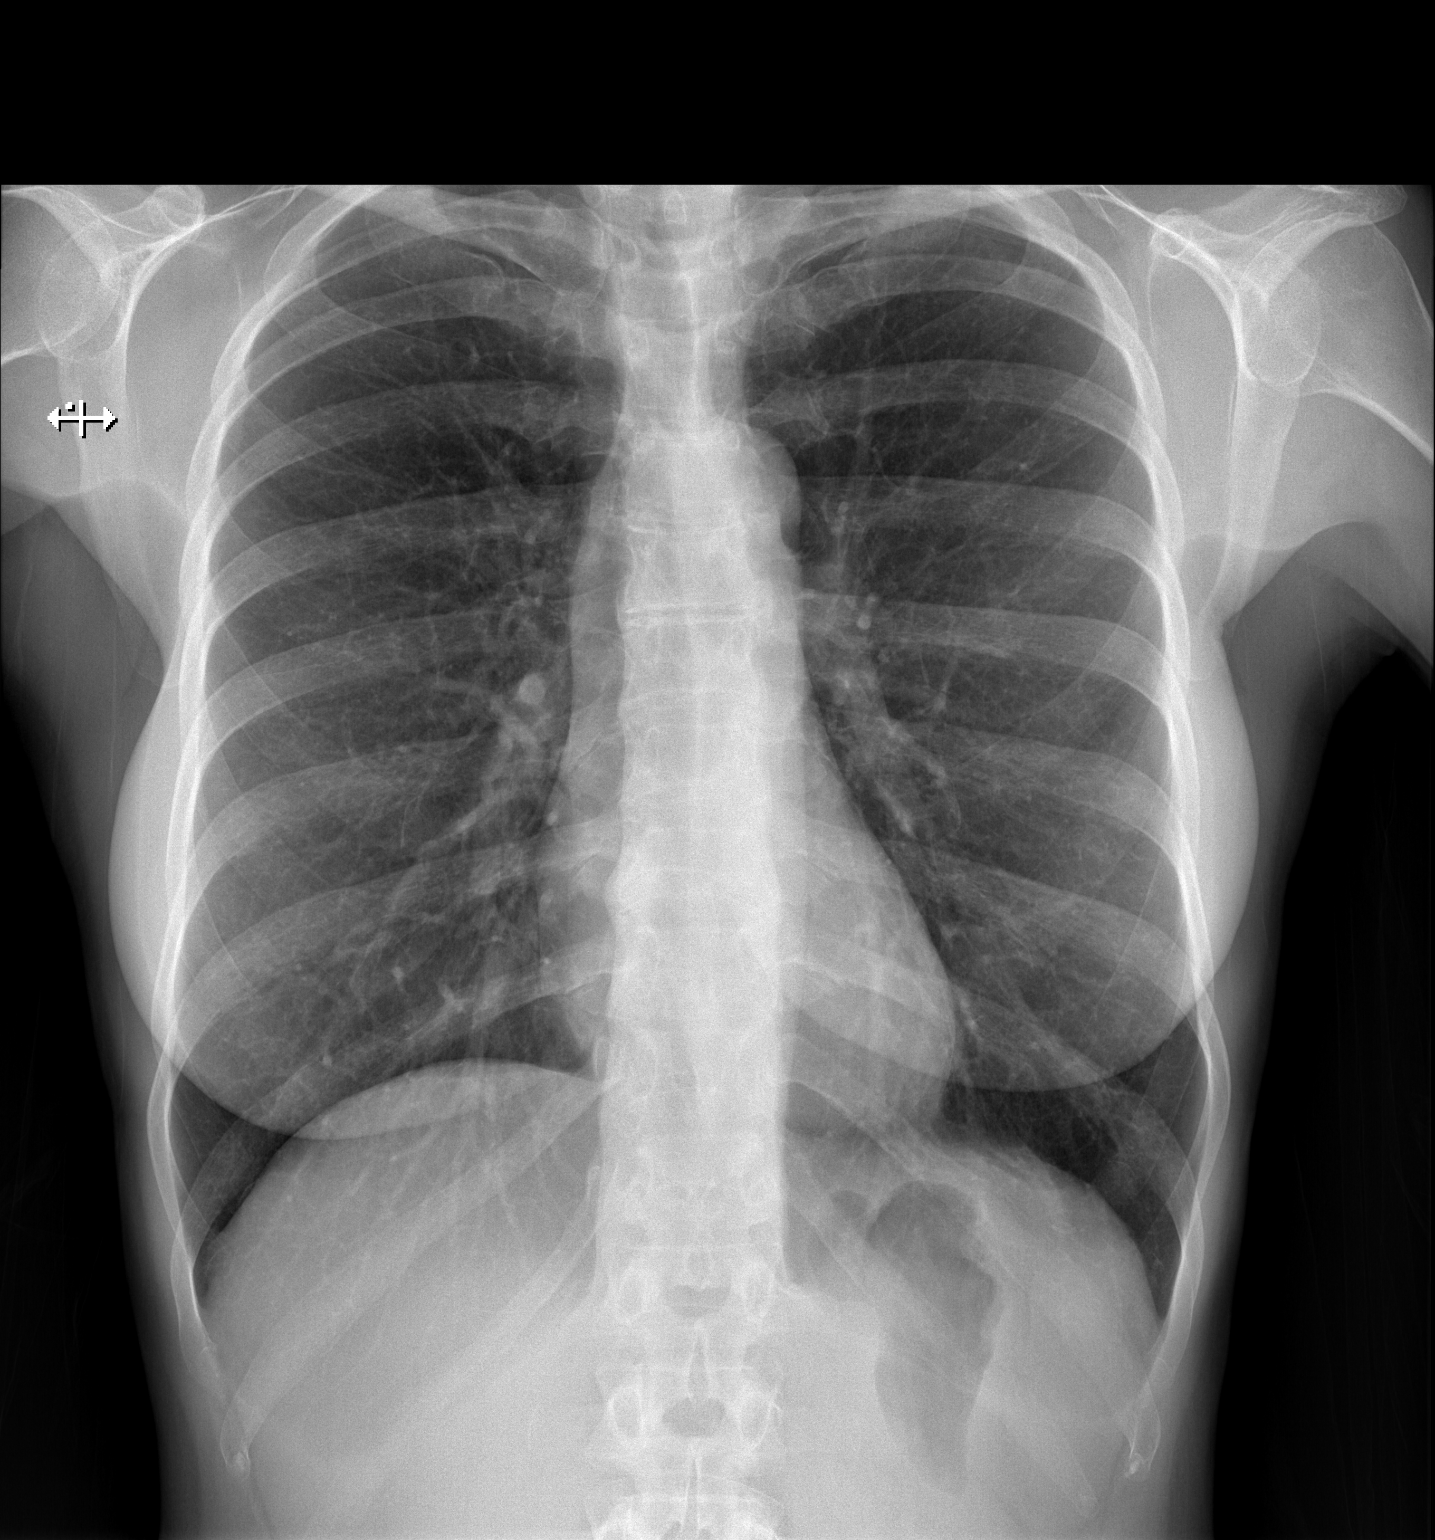

[w chest lat]
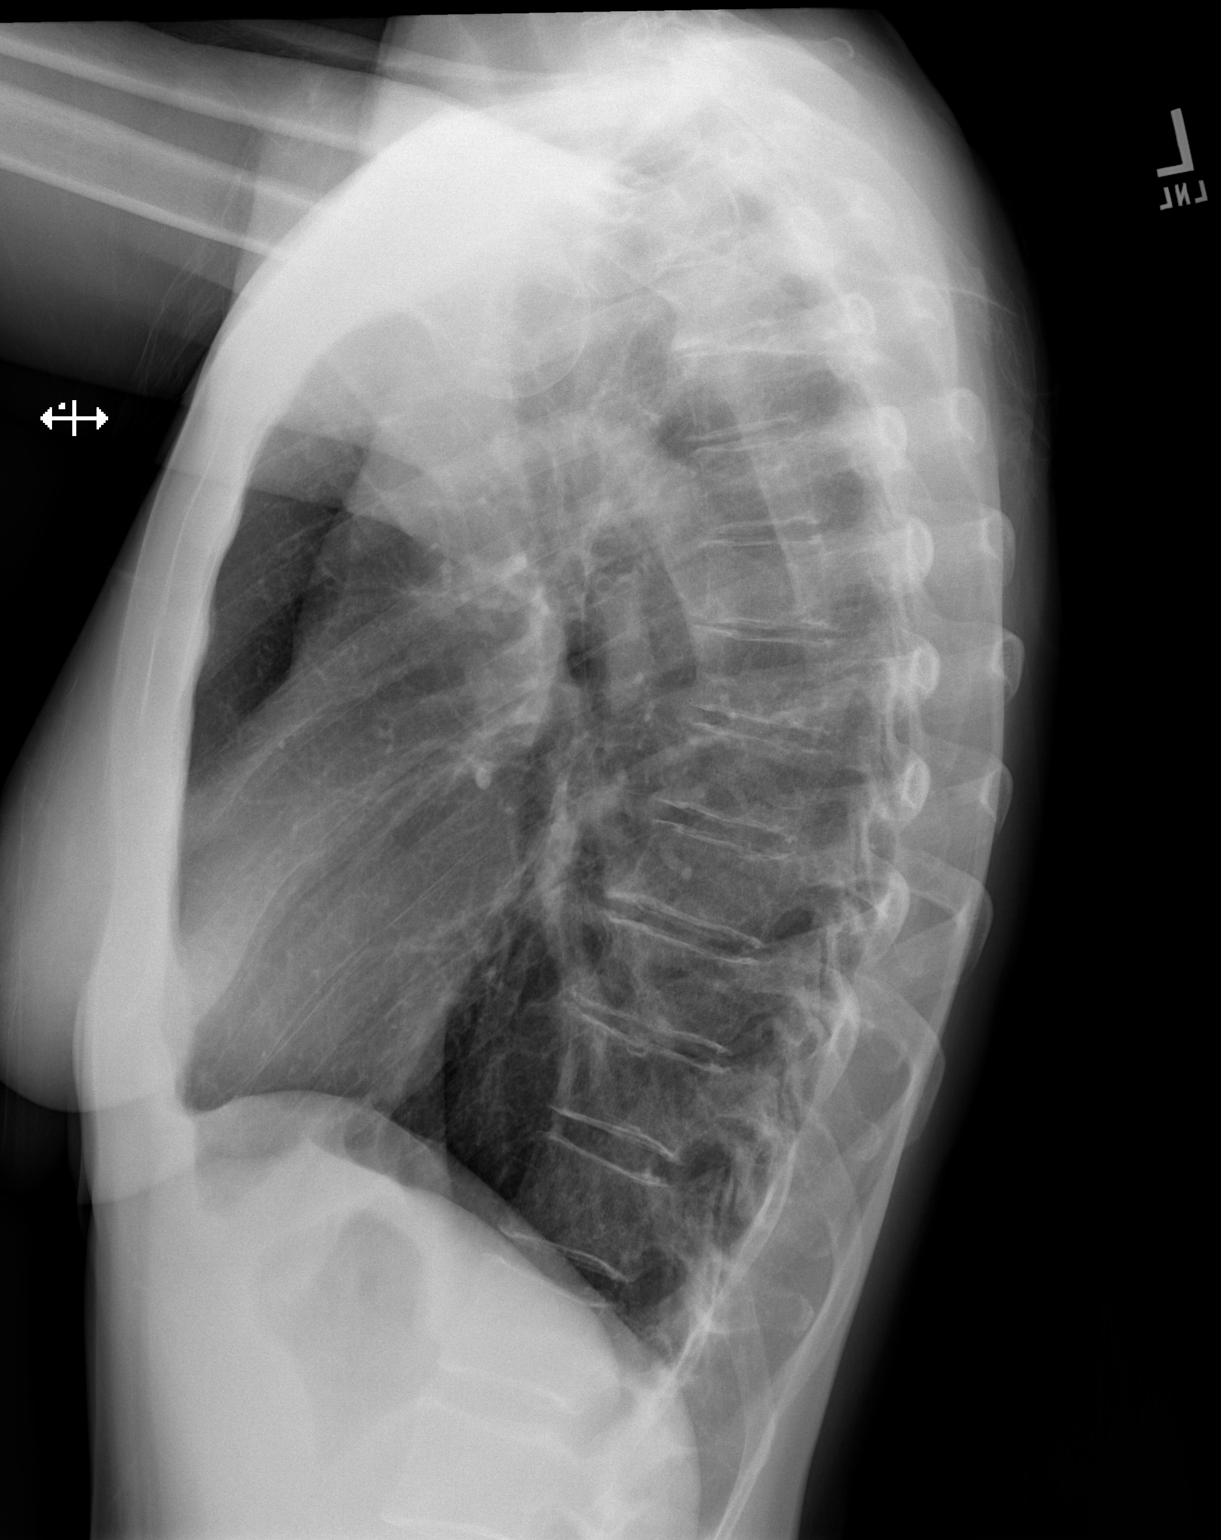

[2 of 2 positions shown; findings below may reference images not displayed]

FINDINGS: The lungs are mildly hyperinflated and clear. The heart and
pulmonary vascularity are normal. There is mild deviation of the
trachea to the right at the thoracic inlet. There is no pleural
effusion. The bony thorax exhibits no acute abnormality.
IMPRESSION: Mild hyperinflation consistent with reactive airway disease or COPD.
There is no acute cardiopulmonary abnormality.

## 2019-05-10 DIAGNOSIS — Z20822 Contact with and (suspected) exposure to covid-19: Secondary | ICD-10-CM | POA: Diagnosis not present

## 2019-05-10 DIAGNOSIS — Z6821 Body mass index (BMI) 21.0-21.9, adult: Secondary | ICD-10-CM | POA: Diagnosis not present

## 2019-05-21 DIAGNOSIS — Z1321 Encounter for screening for nutritional disorder: Secondary | ICD-10-CM | POA: Diagnosis not present

## 2019-05-21 DIAGNOSIS — Z13220 Encounter for screening for lipoid disorders: Secondary | ICD-10-CM | POA: Diagnosis not present

## 2019-05-21 DIAGNOSIS — Z1329 Encounter for screening for other suspected endocrine disorder: Secondary | ICD-10-CM | POA: Diagnosis not present

## 2019-05-21 DIAGNOSIS — Z01419 Encounter for gynecological examination (general) (routine) without abnormal findings: Secondary | ICD-10-CM | POA: Diagnosis not present

## 2019-05-21 DIAGNOSIS — Z1389 Encounter for screening for other disorder: Secondary | ICD-10-CM | POA: Diagnosis not present

## 2019-05-21 DIAGNOSIS — Z130 Encounter for screening for diseases of the blood and blood-forming organs and certain disorders involving the immune mechanism: Secondary | ICD-10-CM | POA: Diagnosis not present

## 2019-11-19 DIAGNOSIS — N390 Urinary tract infection, site not specified: Secondary | ICD-10-CM | POA: Diagnosis not present

## 2019-11-26 ENCOUNTER — Encounter: Payer: Self-pay | Admitting: Internal Medicine

## 2019-11-26 ENCOUNTER — Ambulatory Visit (INDEPENDENT_AMBULATORY_CARE_PROVIDER_SITE_OTHER): Payer: BC Managed Care – PPO | Admitting: Internal Medicine

## 2019-11-26 ENCOUNTER — Other Ambulatory Visit: Payer: Self-pay

## 2019-11-26 VITALS — BP 124/82 | HR 68 | Ht 66.0 in | Wt 131.0 lb

## 2019-11-26 DIAGNOSIS — E890 Postprocedural hypothyroidism: Secondary | ICD-10-CM

## 2019-11-26 DIAGNOSIS — C73 Malignant neoplasm of thyroid gland: Secondary | ICD-10-CM

## 2019-11-26 LAB — T4, FREE: Free T4: 1.23 ng/dL (ref 0.60–1.60)

## 2019-11-26 LAB — TSH: TSH: 2.03 u[IU]/mL (ref 0.35–4.50)

## 2019-11-26 NOTE — Patient Instructions (Signed)
Please continue: - Levothyroxine 88 mcg daily.  Take the thyroid hormone every day, with water, at least 30 minutes before breakfast, separated by at least 4 hours from: - acid reflux medications - calcium - iron - multivitamins  Please stop at the lab.  Please return in 1 year. 

## 2019-11-26 NOTE — Progress Notes (Signed)
Patient ID: Jeanne Roberts, female   DOB: 19-Mar-1965, 54 y.o.   MRN: [id number]   This visit occurred during the SARS-CoV-2 public health emergency.  Safety protocols were in place, including screening questions prior to the visit, additional usage of staff PPE, and extensive cleaning of exam room while observing appropriate contact time as indicated for disinfecting solutions.   HPI  Jeanne Roberts is a 54 y.o.-year-old female, returning for follow-up for thyroid cancer and post surgical hypothyroidism. She previously saw several endocrinologists, last: Dr. Meredith Pel.  Last visit with me 1 year ago. PCP: Dr Theodis Sato  Reviewed her thyroid cancer history:  She was dx'ed with thyroid nodules in 2005, after a thyroid nodule was found by palpation.  Thyroid nodule Bx (05/24/2003):  - Left: benign - Right: benign  Thyroid U/S (11/17/2014): Right thyroid lobe: 64 x 30 x 31 mm. 46 x 27 x 37 mm complex mostly solid mass, inferior pole. Left thyroid lobe: 65 x 38 x 39 mm. Somewhat lobular 53 x 26 x 42 mm solid hyperemic mass, inferior pole. Isthmus Thickness: 6 mm.24 x 17 x 20 mm complex cyst, right isthmus Lymphadenopathy: None visualized. Bilateral calcified carotid plaque is incidentally noted, without assessment for degree of stenosis. IMPRESSION: 1. Thyromegaly with right, left, and isthmic nodules which all meet consensus criteria for biopsy.  Thyroid nodule Bx (01/31/2015): - isthmus nodule: scant follicular epithelium: bethesda categ 1 - L thyroid nodule: benign  Thyroid U/S (07/10/2015): Right thyroid lobe: 7.2 x 3.2 x 3.6 cm. Heterogeneous lower pole nodule measured 2.8 x 4.9 x 2.5 cm and previously measured 4.6 x 2.7 x 3.7 cm. Subcentimeter right upper pole nodules are noted. Left thyroid lobe: 7.3 x 3.4 x 4.5 cm. Solid heterogeneous lower pole nodule measures 4.4 x 5.2 x 2.9 cm and previously measured up to 5.3 cm. Subcentimeter left upper pole nodules are noted. Isthmus Thickness: 0.8  cm. Hypoechoic nodule measures 1.9 x 1.4 x 2.1 cm and previously measured up to 2.4 cm. Lymphadenopathy: None visualized.  IMPRESSION: Bilateral and isthmic nodules are not significantly changed as described above. The largest is on the left and measures up to 5.2 cm.  Thyroid U/S (11/08/2016):  Parenchymal Echotexture: Moderately heterogenous Right lobe: 7 x 2.7 x 3.3 cm, previously 7.2 x 3.2 x 3.6.  Right inferior nodule: 4.2 x 2 x 3.1 cm, previously 4.9 x 2.5 x 2.8 cm, mixed cystic and solid, hypoechoic.  Not previously biopsied  Left lobe: 7 x 3.1 x 4 cm, previously 7.3 x 3.4 x 4.5.  Lobular mid left nodule: 5.7 x 2.5 x 4.8 cm, previously 5.2 x 2.9 x 4.4; this was previously biopsied.  Isthmus: 0.9 cm thickness, previously 0.8 cm.  Very hypoechoic isthmic nodule: 1.5 x 0.8 x 1 cm, previously 2.1 x1.4 x 1.9; this was previously biopsied but with scant material.  IMPRESSION: 1. Thyromegaly with little change in bilateral and isthmic nodules. 2. Recommend FNA biopsy of mildly suspicious 4.2 cm inferior right nodule.  Plan was:  Notes recorded by [name], MD on 11/13/2016 at 12:19 PM EDT .Marland KitchenMarland KitchenHer left thyroid nodule is slightly larger, with the largest dimension increasing from 5.2 cm to 5.7 cm. This nodule was biopsied twice, including last year, and is benign. However, per my understanding, she has more neck compression symptoms and I am curious whether she is interested in thyroidectomy.  The other 2 nodules are slightly smaller, which is very good news: The isthmic nodule has been previously biopsied but the biopsy was  inconclusive. If she does not desire thyroidectomy, we will need to rebiopsy this nodule and most likely also the right thyroid nodule, since this is still large, although, again, decreased in size slightly since last check.  She continued to have neck compression symptoms >> We decided to rebiopsy the right and the isthmic thyroid nodules:  THYROID, FINE NEEDLE  ASPIRATION ISTHMUS (SPECIMEN 1 OF 2, COLLECTED ON 09/29/8336): SCANT FOLLICULAR EPITHELIUM PRESENT (BETHESDA CATEGORY I). [name] MD Pathologist, Electronic Signature (Case signed 08/22/2017) Specimen Clinical Information 1.5 x 0.8 x 1cm very hypoechoic isthmic nodule, previously 2.1 x 1.4 x 1.9; this was previoulsy biopsied, Previous biopsy 1/10/17[name] 1 Source Thyroid, Fine Needle Aspiration, Isthmus, (Specimen 1 of 2, collected on 08/21/2017)  Adequacy Reason Satisfactory For Evaluation. Diagnosis THYROID, FINE NEEDLE ASPIRATION RIGHT (SPECIMEN 2 OF 2, COLLECTED ON 08/21/2017): ATYPIA OF UNDETERMINED SIGNIFICANCE OR FOLLICULAR LESION OF UNDETERMINED SIGNIFICANCE (BETHESDA CATEGORY III). COMMENT: THIS SPECIMEN WILL BE SENT FOR AFIRMA TESTING. [name] MD Pathologist, Electronic Signature (Case signed 08/22/2017) Specimen Clinical Information Right inferior 4.2cm; Other 2 dimensions: 2 x 3cm, previously 4.9 x 2.5 x 2.8cm, Mixed cystic and solid, Hypoechoic, TI-RADS total points 3   AFIRMA molecular testing performed on the right thyroid nodule returned suspicious for cancer.   Patient had total thyroidectomy 11/10/2017:  Diagnosis Thyroid, thyroidectomy - PAPILLARY THYROID CARCINOMA, FOLLICULAR VARIANT, 4 CM. - TUMOR CONFINED WITHIN THYROID CAPSULE. - MARGINS NOT INVOLVED. - SEE ONCOLOGY TABLE.  THYROID GLAND: Procedure: Thyroidectomy. Tumor Focality: Unifocal. Tumor Site: Right lobe. Tumor Size: 4 x 3.2 x 2.4 cm. Histologic Type: Follicular variant of the papillary thyroid carcinoma. Margins: Free of tumor. Angioinvasion: No. Lymphatic Invasion: No. Extrathyroidal: No. Regional Lymph Nodes: No lymph nodes submitted or found. Pathologic Stage Classification (pTNM, AJCC 8th Edition): pT2, pNx Representative tumor block: 1D. Comment(s): The nodule in the right lobe has scattered foci with nuclear cytologic features consistent with papillary thyroid  carcinoma, follicular variant. There is a 6.2 cm nodule in the left lobe which has follicular architecture and scattered foci of cytologic atypia and is consistent with a follicular neoplasm without capsular or vascular invasion. There is a smaller 0.9 cm nodule in the isthmus with features of a colloid nodule. (JDP:ah 11/11/17)   Reviewed his thyroglobulin and ATA antibodies: Lab Results  Component Value Date   THYROGLB 0.3 (L) 11/23/2018   THYROGLB 0.2 (L) 06/12/2018    Lab Results  Component Value Date   THGAB <1 11/23/2018   THGAB <1 06/12/2018    Pt denies: - feeling nodules in neck - hoarseness - dysphagia - choking - SOB with lying down  Pt is on levothyroxine 88 mcg daily, taken: - in am - fasting - takes collagen 1 hours after levothyroxine - in coffee - at least 30 min from b'fast - no calcium - no iron - no multivitamins - no PPIs On vitamin C and Zinc. On Cephalexin now for UTI.  Reviewed her TFTs: Lab Results  Component Value Date   TSH 0.44 11/23/2018   TSH 0.52 06/12/2018   TSH 1.01 12/10/2017   TSH 0.85 05/20/2017   FREET4 1.28 11/23/2018   FREET4 1.30 06/12/2018   FREET4 1.10 12/10/2017   FREET4 0.80 05/20/2017  03/26/2016: TSH 1.62, free T4 1.19, free T3 3.53  03/20/2015: TSH 0.84, free T4 0.83, free T3 2.9  + FH of thyroid ds in mother, who had to have her thyroid removed (not cancer).  + FH of thyroid cancer in sister. No  h/o radiation tx to head or neck.  No seaweed or kelp. No recent contrast studies. No herbal supplements. No Biotin use. No recent steroids use.   After the surgery, she recovered and restarted running - running 7 mi 4 days a week on ave. She had hair loss, which resolved after sx.  ROS: Constitutional: no weight gain/no weight loss, no fatigue, no subjective hyperthermia, no subjective hypothermia, + urinary frequency and burning - on Cephalexin Eyes: no blurry vision, no xerophthalmia ENT: no sore throat, + see  HPI Cardiovascular: no CP/no SOB/no palpitations/no leg swelling Respiratory: no cough/no SOB/no wheezing Gastrointestinal: no N/no V/no D/no C/no acid reflux Musculoskeletal: no muscle aches/no joint aches Skin: no rashes, no hair loss Neurological: no tremors/no numbness/no tingling/no dizziness  I reviewed pt's medications, allergies, PMH, social hx, family hx, and changes were documented in the history of present illness. Otherwise, unchanged from my initial visit note.  PMH: Patient Active Problem List   Diagnosis Date Noted  . Postsurgical hypothyroidism 12/10/2017    Priority: High  . 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 Hair loss 05/20/2017   Past Surgical History:  Procedure Laterality Date  . COLONOSCOPY    . THYROIDECTOMY N/A 11/10/2017   Procedure: TOTAL THYROIDECTOMY;  Surgeon: Armandina Gemma, MD;  Location: [facility];  Service: General;  Laterality: N/A;  . VARICOSE VEIN SURGERY Bilateral   . WISDOM TOOTH EXTRACTION       Social History   Social History  . Marital status: Married    Spouse name: N/A  . Number of children: 0   Occupational History  . n/a   Social History Main Topics  . Smoking status: Never Smoker  . Smokeless tobacco: Never Used  . Alcohol use occasional  . Drug use: no   Current Outpatient Medications  Medication Sig Dispense Refill  . EVENING PRIMROSE OIL PO Take 1 capsule by mouth See admin instructions. Take 1 capsule by mouth 5 times weekly    . ibuprofen (ADVIL,MOTRIN) 200 MG tablet Take 400 mg by mouth every 6 (six) hours as needed for headache, mild pain or moderate pain.     Marland Kitchen levothyroxine (SYNTHROID) 88 MCG tablet TAKE 1 TABLET(88 MCG) BY MOUTH DAILY 90 tablet 3  . OVER THE COUNTER MEDICATION Take 1 tablet by mouth daily. Nurturlax with Aloe Supplement    . Probiotic Product (PROBIOTIC PO) Take 1 capsule by mouth daily.     No current facility-administered medications for this  visit.   No Known Allergies   Family History  Problem Relation Age of Onset  . Hypertension Mother   . Thyroid disease Mother   . Diabetes Father   . Renal Disease Father   . Thyroid disease Sister   . Hypertension Maternal Uncle   . Heart disease Maternal Uncle      PE: BP 124/82   Pulse 68   Ht $R'5\' 6"'Sr$  (1.676 m)   Wt 131 lb (59.4 kg)   SpO2 97%   BMI 21.14 kg/m  Wt Readings from Last 3 Encounters:  11/26/19 131 lb (59.4 kg)  11/23/18 135 lb (61.2 kg)  06/12/18 132 lb (59.9 kg)   Constitutional: normal weight, in NAD Eyes: PERRLA, EOMI, no exophthalmos ENT: moist mucous membranes, no neck masses palpated, surgical scar healed, no cervical lymphadenopathy Cardiovascular: RRR, No MRG Respiratory: CTA B Gastrointestinal: abdomen soft, NT, ND, BS+ Musculoskeletal: no deformities, strength intact in all 4 Skin: moist, warm,  no rashes Neurological: no tremor with outstretched hands, DTR normal in all 4  ASSESSMENT: 1.  Papillary thyroid cancer, follicular variant -Family history of thyroid cancer in her sister (recent diagnosis)  2.  Postsurgical hypothyroidism  PLAN: 1.PTC -Follicular variant -She has a history of nontoxic goiter, with 3 dominant nodules. The isthmic thyroid nodule was biopsied with inconclusive results, but the Afirma molecular marker was suspicious. She had total thyroidectomy in 10/2017 by Dr. Harlow Asa. Final diagnosis was follicular variant of papillary thyroid cancer, 4 cm (stage II TNM path). The cancer was confined to the thyroid capsule and margins were not involved. She did not have lymphovascular invasion. Based on the fact that she was less than 57 years old at the time of diagnosis, she qualified as stage I TNM clinically, which is very good prognosis. Therefore, we did not proceed with RAI remnant ablation. -At last visit, we checked a thyroid ultrasound (12/21/2018): No abnormal masses -At last visit, she again had undetectable ATA's and a very  low thyroglobulin -We will repeat these now -RTC in 1 year  2.  Postsurgical hypothyroidism - latest thyroid labs reviewed with pt >> low normal (at goal): Lab Results  Component Value Date   TSH 0.44 11/23/2018   - she continues on LT4 88 mcg daily - pt feels good on this dose. - we discussed about taking the thyroid hormone every day, with water, >30 minutes before breakfast, separated by >4 hours from acid reflux medications, calcium, iron, multivitamins. Pt. is taking it correctly. - will check thyroid tests today: TSH and fT4 - If labs are abnormal, she will need to return for repeat TFTs in 1.5 months  Needs refills.   Component     Latest Ref Rng & Units 11/26/2019  Thyroglobulin     ng/mL 0.4 (L)  Comment        TSH     0.35 - 4.50 uIU/mL 2.03  T4,Free(Direct)     0.60 - 1.60 ng/dL 1.23  Thyroglobulin Ab     < or = 1 IU/mL 2 (H)   TSH normal.  However, I would prefer a lower TSH so for now I would advise her to increase the dose to 100 mcg daily.  We will repeat her TFTs in 1.5 months. Tg is slightly higher than before and now also her thyroglobulin antibodies are elevated.  I would like to repeat her thyroglobulin and ATA antibodies by the [facility] assay when she returns back for the rest of the thyroid tests.  [name], MD PhD [facility] Endocrinology

## 2019-11-29 DIAGNOSIS — R102 Pelvic and perineal pain: Secondary | ICD-10-CM | POA: Diagnosis not present

## 2019-11-29 DIAGNOSIS — R350 Frequency of micturition: Secondary | ICD-10-CM | POA: Diagnosis not present

## 2019-11-29 LAB — THYROGLOBULIN ANTIBODY: Thyroglobulin Ab: 2 IU/mL — ABNORMAL HIGH (ref <=1)

## 2019-11-29 LAB — THYROGLOBULIN LEVEL: Thyroglobulin: 0.4 ng/mL — ABNORMAL LOW

## 2019-11-30 MED ORDER — LEVOTHYROXINE SODIUM 100 MCG PO TABS: ORAL_TABLET | ORAL | 3 refills | Status: DC

## 2019-12-06 ENCOUNTER — Encounter: Payer: Self-pay | Admitting: Internal Medicine

## 2019-12-10 DIAGNOSIS — Z1239 Encounter for other screening for malignant neoplasm of breast: Secondary | ICD-10-CM | POA: Diagnosis not present

## 2019-12-10 DIAGNOSIS — Z1231 Encounter for screening mammogram for malignant neoplasm of breast: Secondary | ICD-10-CM | POA: Diagnosis not present

## 2020-01-07 ENCOUNTER — Other Ambulatory Visit: Payer: Self-pay

## 2020-01-07 ENCOUNTER — Other Ambulatory Visit (INDEPENDENT_AMBULATORY_CARE_PROVIDER_SITE_OTHER): Payer: BC Managed Care – PPO

## 2020-01-07 DIAGNOSIS — E890 Postprocedural hypothyroidism: Secondary | ICD-10-CM

## 2020-01-07 DIAGNOSIS — C73 Malignant neoplasm of thyroid gland: Secondary | ICD-10-CM | POA: Diagnosis not present

## 2020-01-07 LAB — TSH: TSH: 0.51 u[IU]/mL (ref 0.35–4.50)

## 2020-01-07 LAB — T4, FREE: Free T4: 1.25 ng/dL (ref 0.60–1.60)

## 2020-01-08 LAB — TGAB+THYROGLOBULIN IMA OR LCMS: Thyroglobulin Antibody: <1 IU/mL (ref 0.0–0.9)

## 2020-01-08 LAB — THYROGLOBULIN BY IMA: Thyroglobulin by IMA: 0.2 ng/mL — ABNORMAL LOW (ref 1.5–38.5)

## 2020-03-16 IMAGING — US US FNA BIOPSY THYROID 1ST LESION
1 series · 12 of 25 positions shown · non-contrast
Comparison: Thyroid ultrasound dated 11/08/2016

MEDICATIONS:
None

COMPLICATIONS:
None immediate.

INDICATION: Patient with history of multinodular goiter and previous needle
aspirate biopsy of left thyroid nodule and isthmic nodule in 9413.
The left nodule was benign and the isthmic nodule revealed scant
follicular epithelium. Latest thyroid ultrasound from 11/08/2016
revealed multinodular thyroid gland with 4.2 cm right inferior
nodule which meets criteria for biopsy. She presents again today for
needle aspirate biopsy of this right thyroid nodule as well as
repeat biopsy of the isthmic nodule.

EXAM:
ULTRASOUND GUIDED FINE NEEDLE ASPIRATION BIOPSY OF RIGHT INFERIOR
AND ISTHMIC THYROID NODULES
TECHNIQUE: Informed written consent was obtained from the patient after a
discussion of the risks, benefits and alternatives to treatment.
Questions regarding the procedure were encouraged and answered. A
timeout was performed prior to the initiation of the procedure.

[Series 1: us fna biopsy thyroid 1st lesion · 0.06mm/px · 33 acquisitions, 12 frames shown]
[im 2/33]
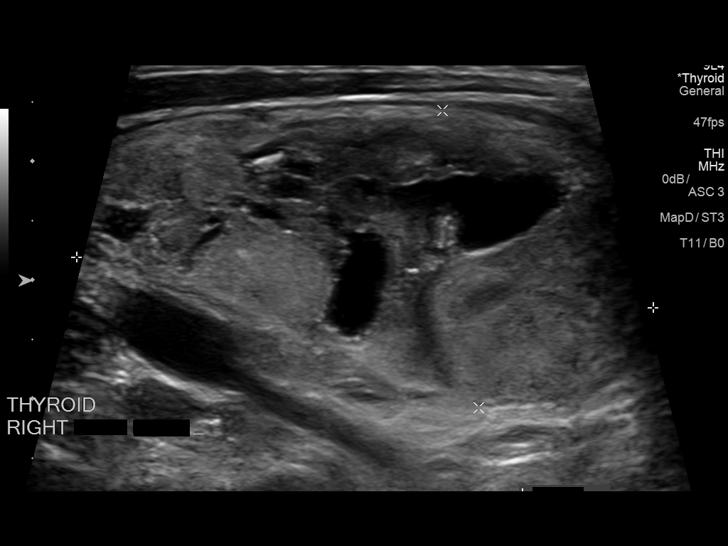
[im 5/33]
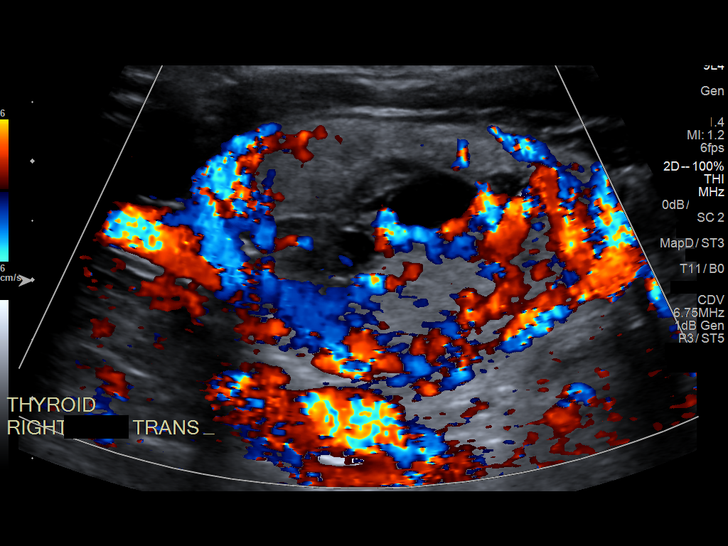
[im 7/33]
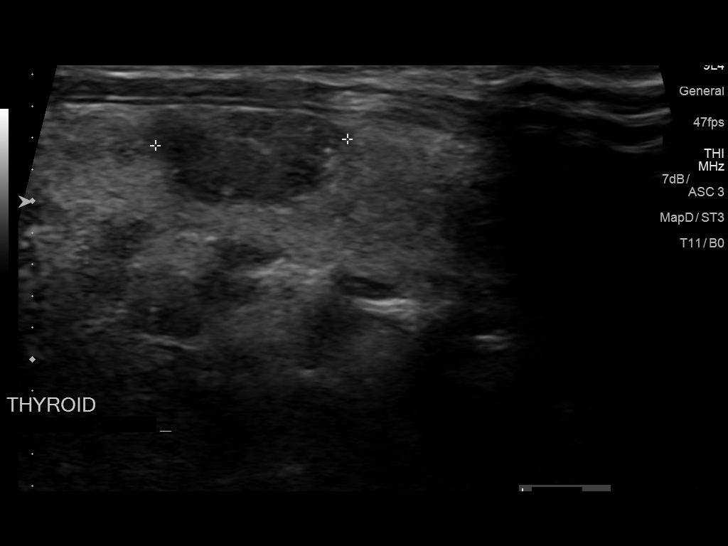
[im 10/33]
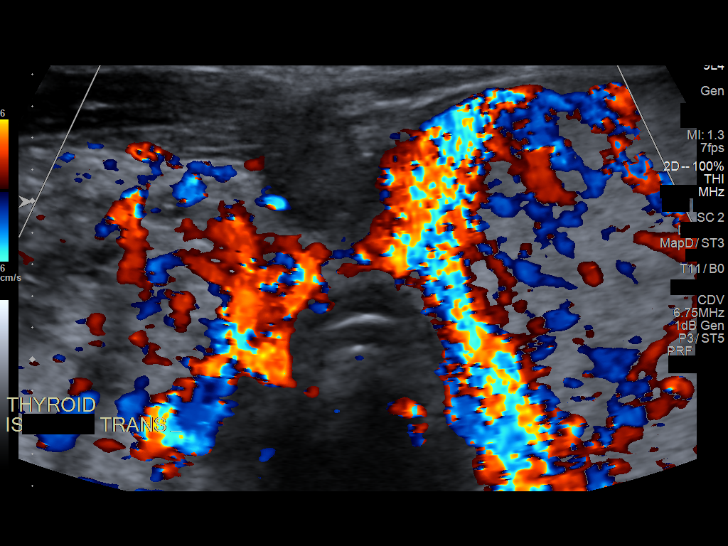
[im 13/33]
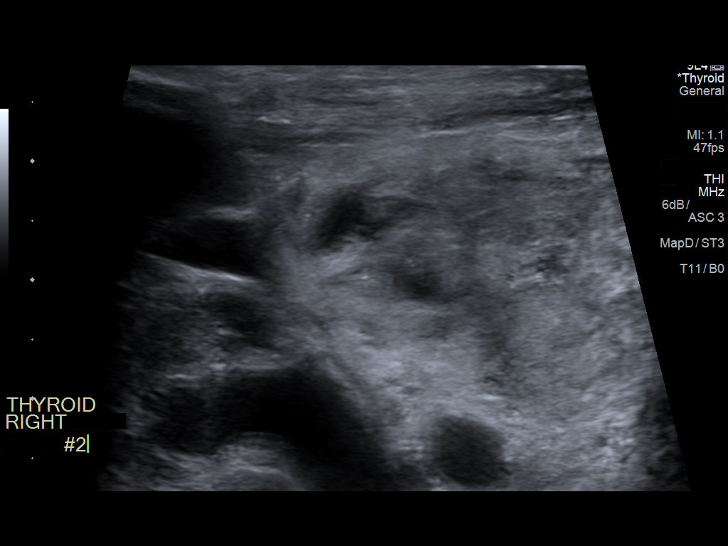
[im 15/33]
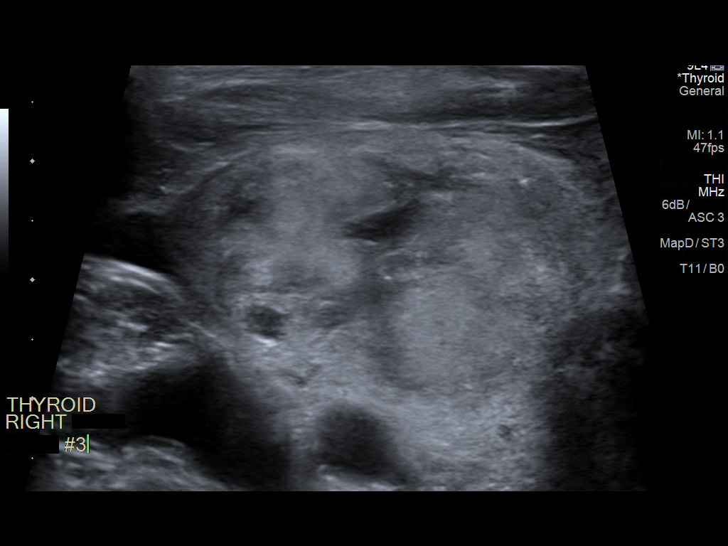
[im 18/33]
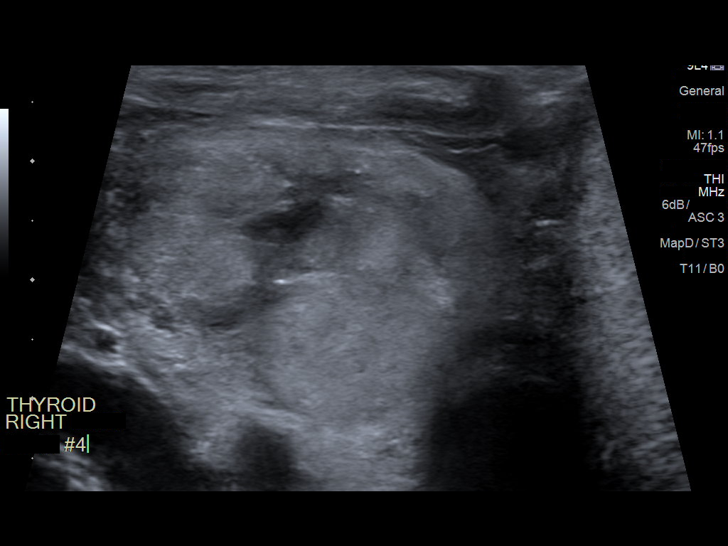
[im 21/33]
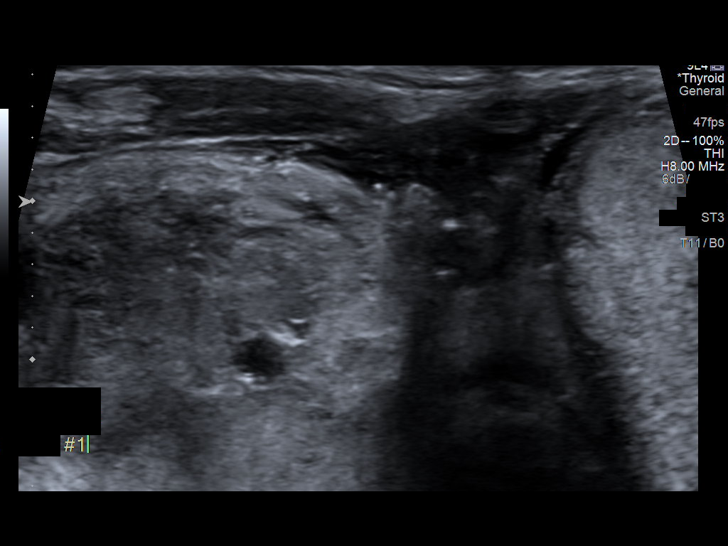
[im 23/33]
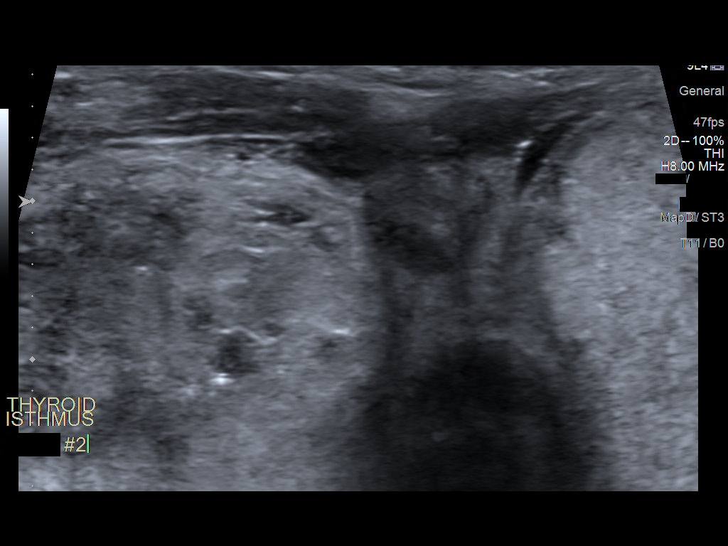
[im 26/33]
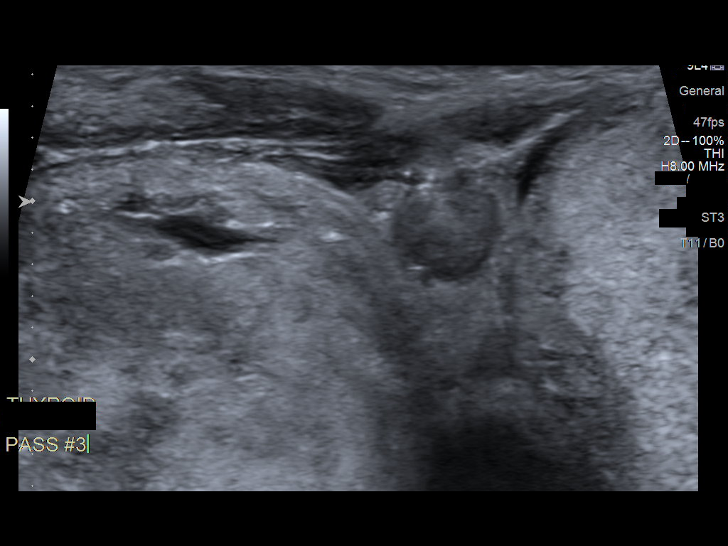
[im 29/33]
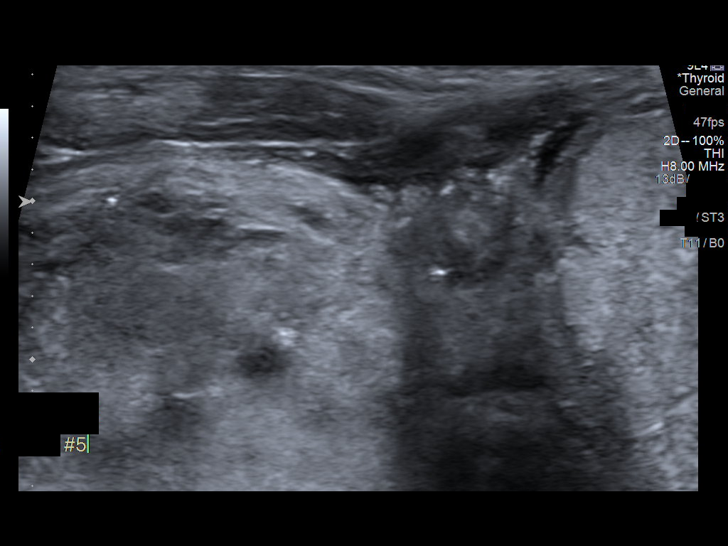
[im 31/33]
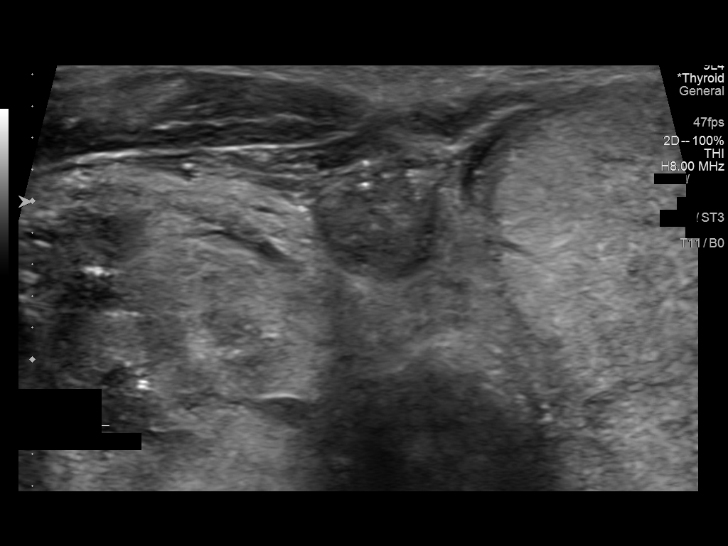

[12 of 25 positions shown; findings below may reference images not displayed]

Pre-procedural ultrasound scanning demonstrated unchanged size and
appearance of the indeterminate nodule within the right inferior
lobe. The isthmic nodule was slightly smaller in size from previous
exam.

The procedure was planned. The neck was prepped in the usual sterile
fashion, and a sterile drape was applied covering the operative
field. A timeout was performed prior to the initiation of the
procedure. Local anesthesia was provided with 1% lidocaine.

Under direct ultrasound guidance, 5 FNA biopsies were performed of
the right inferior thyroid nodule with 25 gauge needles. Multiple
ultrasound images were saved for procedural documentation purposes.
The samples were prepared and submitted to pathology as well as for
Afirma testing.

Under direct ultrasound guidance, 5 FNA biopsies were performed of
the isthmic nodule with 25 gauge needles. Multiple ultrasound images
were saved for procedural documentation purposes. The samples were
prepared and submitted to pathology as well as for Afirma testing.

Limited post procedural scanning was negative for hematoma or
additional complication. Dressings were placed. The patient
tolerated the above procedures procedure well without immediate
postprocedural complication.
FINDINGS: Nodule reference number based on prior diagnostic ultrasound: 1

Maximum size: 4.2 cm

Location: Right; Inferior

ACR TI-RADS risk category: TR3 (3 points)

Reason for biopsy: meets ACR TI-RADS criteria

_________________________________________________________

Isthmic nodule was previously biopsied in 9413, now measuring
approximately 1.2 cm in maximum diameter

Ultrasound imaging confirms appropriate placement of the needles
within the thyroid nodules.
IMPRESSION: 1. Technically successful ultrasound guided fine needle aspiration
biopsy of right inferior thyroid nodule
2. Technically successful ultrasound guided fine needle aspiration
biopsy of isthmic nodule. Final pathology pending.

## 2020-05-24 ENCOUNTER — Encounter: Payer: Self-pay | Admitting: Internal Medicine

## 2020-05-24 ENCOUNTER — Other Ambulatory Visit: Payer: Self-pay | Admitting: *Deleted

## 2020-05-24 MED ORDER — LEVOTHYROXINE SODIUM 100 MCG PO TABS: ORAL_TABLET | ORAL | 1 refills | Status: DC

## 2020-06-05 IMAGING — US US THYROID
1 series · 14 of 18 positions shown · non-contrast
Comparison: Thyroid ultrasound-11/08/2016;

CLINICAL DATA: Other. History of papillary thyroid cancer, post
total thyroidectomy.

EXAM:
THYROID ULTRASOUND
TECHNIQUE: Ultrasound examination of the thyroid gland and adjacent soft
tissues was performed.

[Series 1: us thyroid · 0.04mm/px · 14 of 18 slices shown]
[im 1/18]
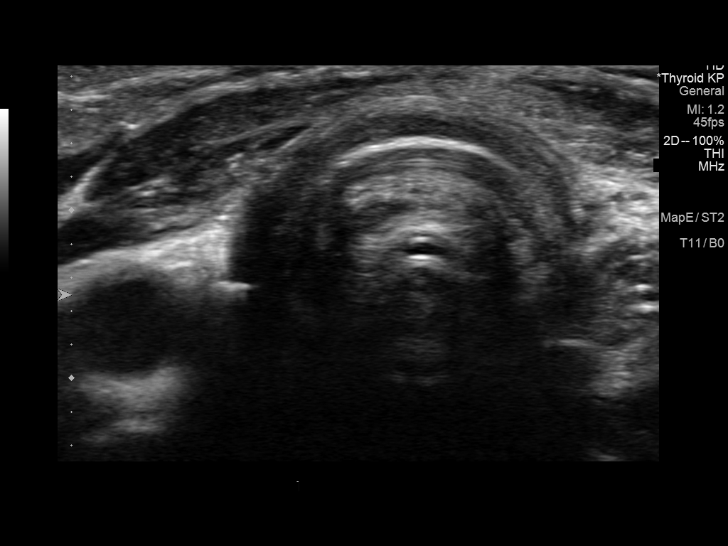
[im 2/18]
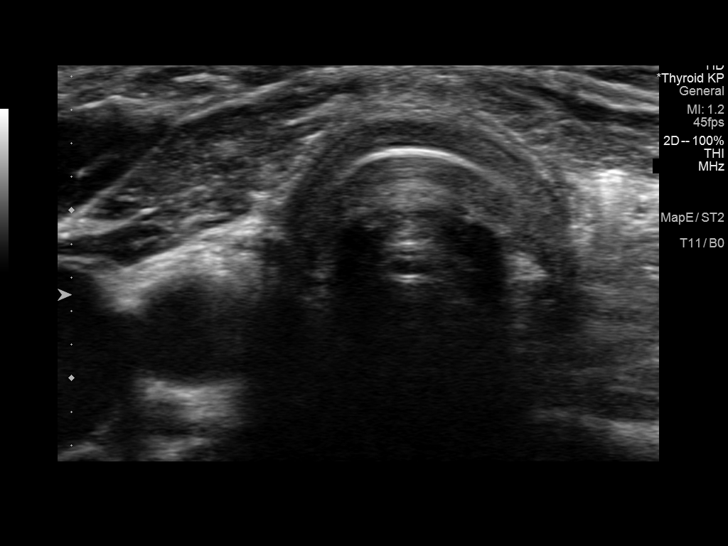
[im 4/18]
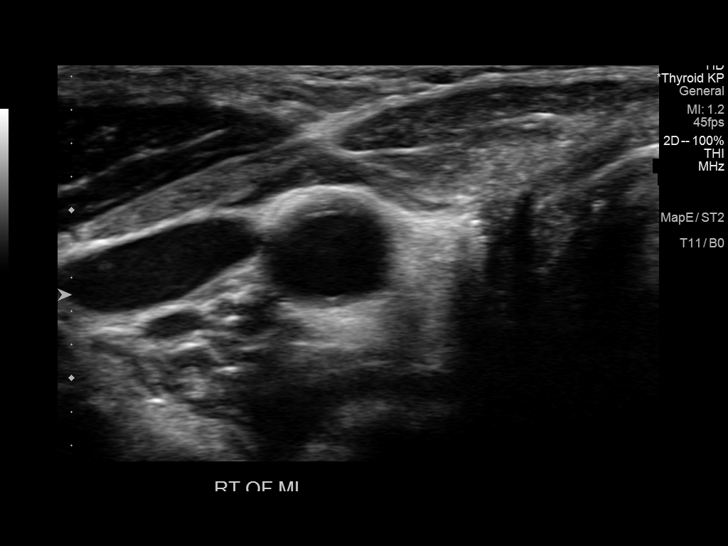
[im 5/18]
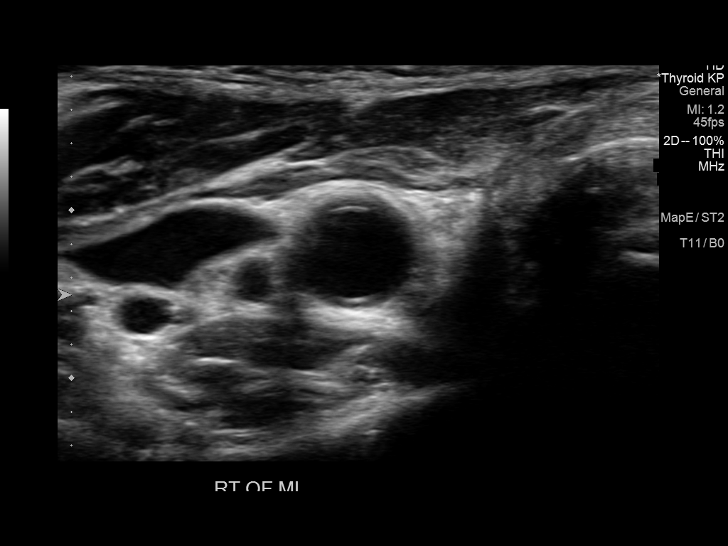
[im 6/18]
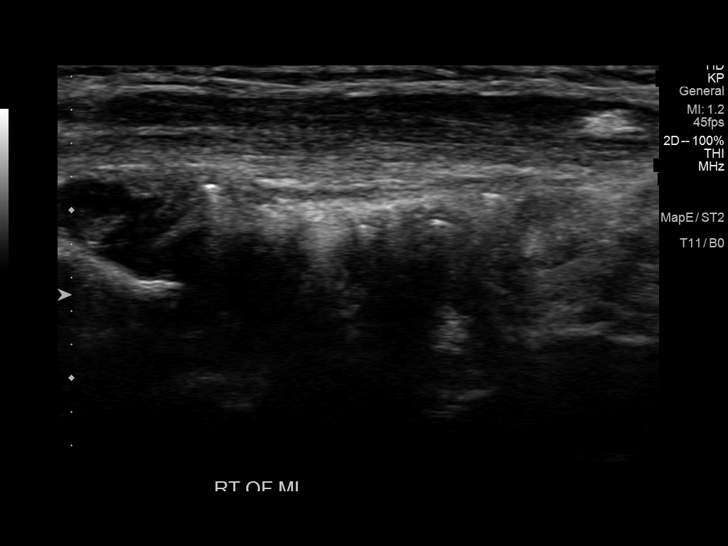
[im 8/18]
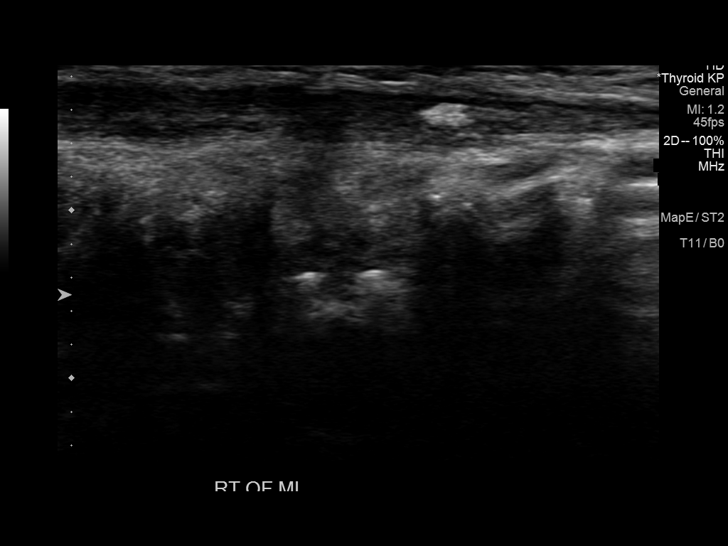
[im 9/18]
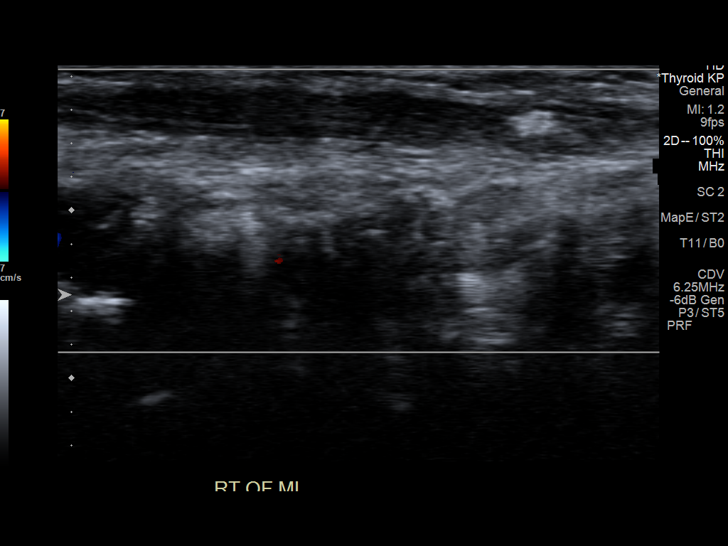
[im 10/18]
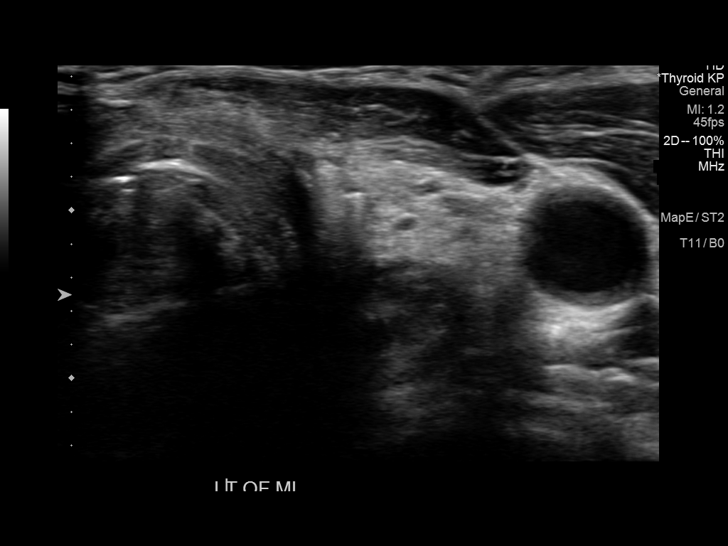
[im 11/18]
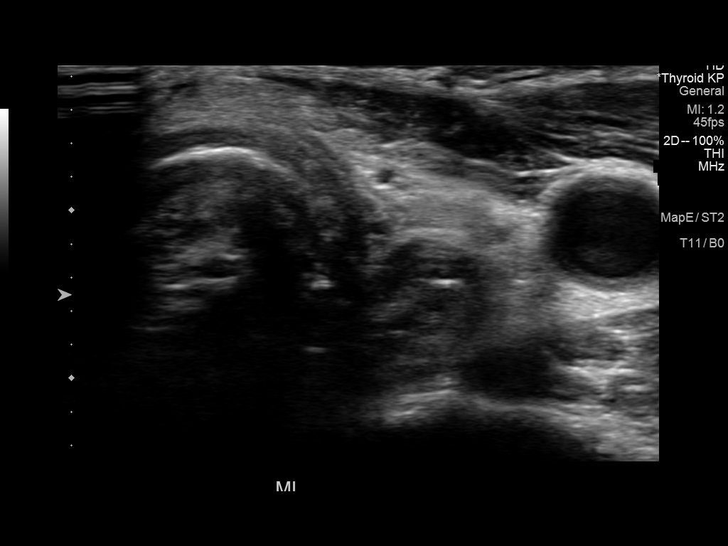
[im 13/18]
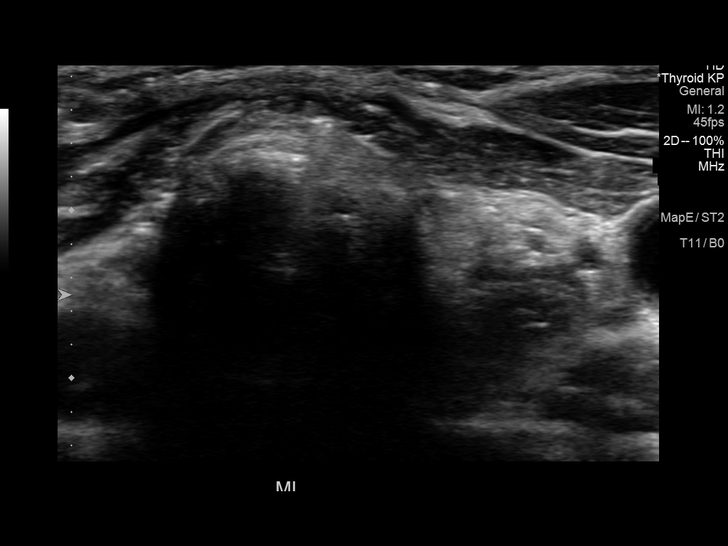
[im 14/18]
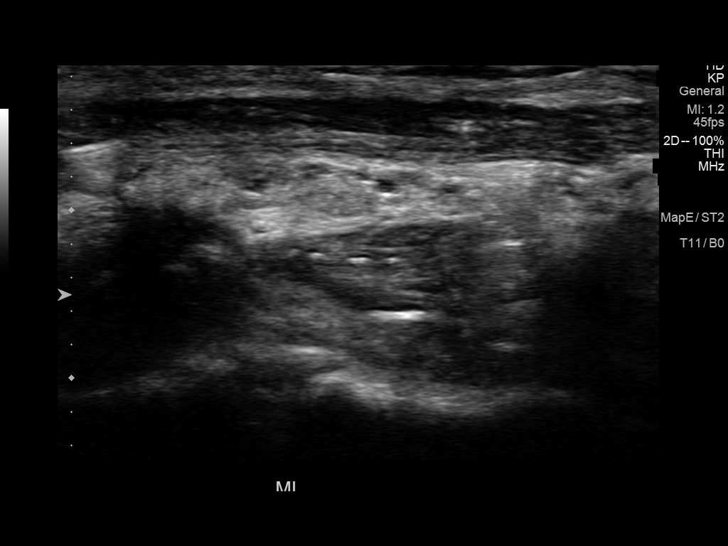
[im 15/18]
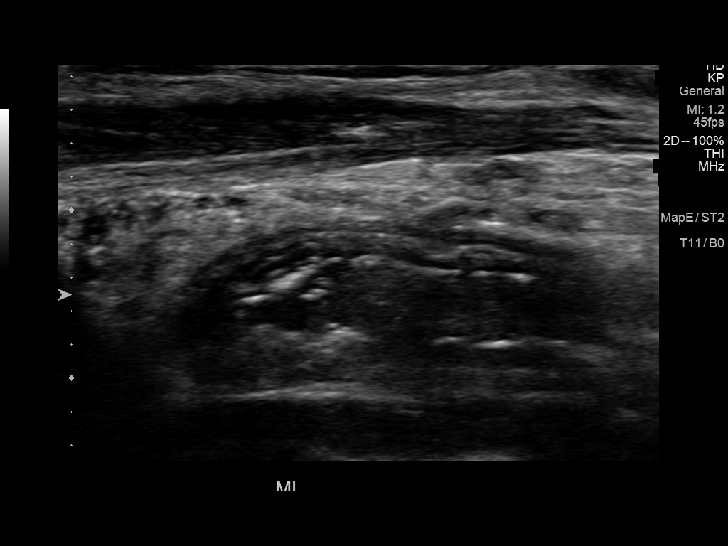
[im 17/18]
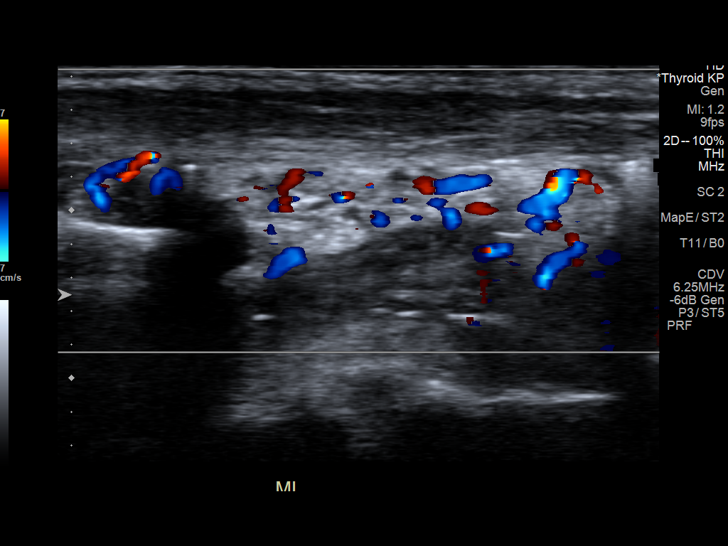
[im 18/18]
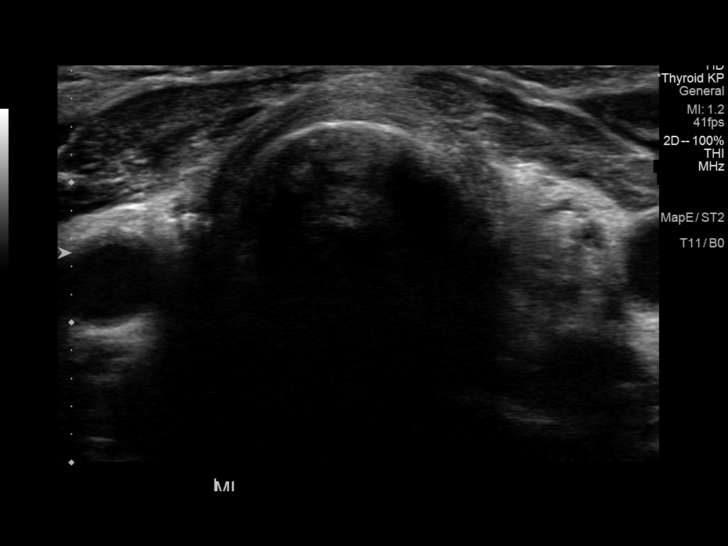

[14 of 18 positions shown; findings below may reference images not displayed]

ultrasound-guided right
inferior and isthmic nodule fine-needle aspiration-08/21/2017
FINDINGS: Isthmus: Surgically absent. There is no residual nodular soft tissue
within the isthmic resection bed.

Right lobe: Surgically absent. There is no residual nodular soft
tissue within the right lobectomy resection bed.

Left lobe: Surgically absent. There is no residual nodular soft
tissue within the left lobectomy resection bed.

_________________________________________________________

No regional cervical lymphadenopathy.
IMPRESSION: Post total thyroidectomy without evidence of residual or locally
recurrent disease.

## 2020-11-21 ENCOUNTER — Other Ambulatory Visit: Payer: Self-pay | Admitting: Internal Medicine

## 2020-12-01 ENCOUNTER — Ambulatory Visit (INDEPENDENT_AMBULATORY_CARE_PROVIDER_SITE_OTHER): Payer: 59 | Admitting: Internal Medicine

## 2020-12-01 ENCOUNTER — Other Ambulatory Visit: Payer: Self-pay

## 2020-12-01 ENCOUNTER — Encounter: Payer: Self-pay | Admitting: Internal Medicine

## 2020-12-01 VITALS — BP 120/82 | HR 64 | Ht 66.0 in | Wt 132.0 lb

## 2020-12-01 DIAGNOSIS — E890 Postprocedural hypothyroidism: Secondary | ICD-10-CM

## 2020-12-01 DIAGNOSIS — C73 Malignant neoplasm of thyroid gland: Secondary | ICD-10-CM

## 2020-12-01 LAB — TSH: TSH: 0.09 u[IU]/mL — ABNORMAL LOW (ref 0.35–5.50)

## 2020-12-01 LAB — T4, FREE: Free T4: 1.35 ng/dL (ref 0.60–1.60)

## 2020-12-01 MED ORDER — LEVOTHYROXINE SODIUM 100 MCG PO TABS: ORAL_TABLET | ORAL | 1 refills | Status: DC

## 2020-12-01 NOTE — Progress Notes (Signed)
Patient ID: Jeanne Roberts, female   DOB: 1965-03-25, 55 y.o.   MRN: [id number]   This visit occurred during the SARS-CoV-2 public health emergency.  Safety protocols were in place, including screening questions prior to the visit, additional usage of staff PPE, and extensive cleaning of exam room while observing appropriate contact time as indicated for disinfecting solutions.   HPI  Jeanne Roberts is a 55 y.o.-year-old female, returning for follow-up for thyroid cancer and post surgical hypothyroidism. She previously saw several endocrinologists, last: Dr. Meredith Pel.  Last visit with me 1 year ago. PCP: Dr Theodis Sato  Interim hx: No problems swallowing, choking, any masses felt in neck. She has hot flushes. She lost 10 lbs after the change in LT4  dose last year >> gained them back.  Reviewed her thyroid cancer history:  She was dx'ed with thyroid nodules in 2005, after a thyroid nodule was found by palpation.  Thyroid nodule Bx (05/24/2003):  - Left: benign - Right: benign  Thyroid U/S (11/17/2014): Right thyroid lobe: 64 x 30 x 31 mm. 46 x 27 x 37 mm complex mostly solid mass, inferior pole. Left thyroid lobe: 65 x 38 x 39 mm. Somewhat lobular 53 x 26 x 42 mm solid hyperemic mass, inferior pole. Isthmus Thickness: 6 mm.  24 x 17 x 20 mm complex cyst, right isthmus Lymphadenopathy: None visualized. Bilateral calcified carotid plaque is incidentally noted, without assessment for degree of stenosis. IMPRESSION: 1. Thyromegaly with right, left, and isthmic nodules which all meet consensus criteria for biopsy.  Thyroid nodule Bx (01/31/2015): - isthmus nodule: scant follicular epithelium: bethesda categ 1 - L thyroid nodule: benign  Thyroid U/S (07/10/2015): Right thyroid lobe: 7.2 x 3.2 x 3.6 cm. Heterogeneous lower pole nodule measured 2.8 x 4.9 x 2.5 cm and previously measured 4.6 x 2.7 x 3.7 cm. Subcentimeter right upper pole nodules are noted. Left thyroid lobe: 7.3 x 3.4 x 4.5 cm.  Solid heterogeneous lower pole nodule measures 4.4 x 5.2 x 2.9 cm and previously measured up to 5.3 cm. Subcentimeter left upper pole nodules are noted. Isthmus Thickness: 0.8 cm. Hypoechoic nodule measures 1.9 x 1.4 x 2.1 cm and previously measured up to 2.4 cm. Lymphadenopathy: None visualized.   IMPRESSION: Bilateral and isthmic nodules are not significantly changed as described above. The largest is on the left and measures up to 5.2 cm.  Thyroid U/S (11/08/2016):  Parenchymal Echotexture: Moderately heterogenous Right lobe: 7 x 2.7 x 3.3 cm, previously 7.2 x 3.2 x 3.6.  Right inferior nodule: 4.2 x 2 x 3.1 cm, previously 4.9 x 2.5 x 2.8 cm, mixed cystic and solid, hypoechoic.  Not previously biopsied  Left lobe: 7 x 3.1 x 4 cm, previously 7.3 x 3.4 x 4.5.  Lobular mid left nodule: 5.7 x 2.5 x 4.8 cm, previously 5.2 x 2.9 x 4.4; this was previously biopsied.  Isthmus: 0.9 cm thickness, previously 0.8 cm.  Very hypoechoic isthmic nodule: 1.5 x 0.8 x 1 cm, previously 2.1 x1.4 x 1.9; this was previously biopsied but with scant material.   IMPRESSION: 1. Thyromegaly with little change in bilateral and isthmic nodules. 2. Recommend FNA biopsy of mildly suspicious 4.2 cm inferior right nodule.  Plan was:  Notes recorded by [name], MD on 11/13/2016 at 12:19 PM EDT .Marland KitchenMarland KitchenHer left thyroid nodule is slightly larger, with the largest dimension increasing from 5.2 cm to 5.7 cm. This nodule was biopsied twice, including last year, and is benign. However, per my understanding, she has more  neck compression symptoms and I am curious whether she is interested in thyroidectomy.  The other 2 nodules are slightly smaller, which is very good news: The isthmic nodule has been previously biopsied but the biopsy was inconclusive. If she does not desire thyroidectomy, we will need to rebiopsy this nodule and most likely also the right thyroid nodule, since this is still large, although, again, decreased in  size slightly since last check.  She continued to have neck compression symptoms >> We decided to rebiopsy the right and the isthmic thyroid nodules:  THYROID, FINE NEEDLE ASPIRATION ISTHMUS (SPECIMEN 1 OF 2, COLLECTED ON 05/28/996): SCANT FOLLICULAR EPITHELIUM PRESENT (BETHESDA CATEGORY I). [name] MD Pathologist, Electronic Signature (Case signed 08/22/2017) Specimen Clinical Information 1.5 x 0.8 x 1cm very hypoechoic isthmic nodule, previously 2.1 x 1.4 x 1.9; this was previoulsy biopsied, Previous biopsy 1/10/17[name] 1 Source Thyroid, Fine Needle Aspiration, Isthmus, (Specimen 1 of 2, collected on 08/21/2017)  Adequacy Reason Satisfactory For Evaluation. Diagnosis THYROID, FINE NEEDLE ASPIRATION RIGHT (SPECIMEN 2 OF 2, COLLECTED ON 08/21/2017): ATYPIA OF UNDETERMINED SIGNIFICANCE OR FOLLICULAR LESION OF UNDETERMINED SIGNIFICANCE (BETHESDA CATEGORY III). COMMENT: THIS SPECIMEN WILL BE SENT FOR AFIRMA TESTING. [name] MD Pathologist, Electronic Signature (Case signed 08/22/2017) Specimen Clinical Information Right inferior 4.2cm; Other 2 dimensions: 2 x 3cm, previously 4.9 x 2.5 x 2.8cm, Mixed cystic and solid, Hypoechoic, TI-RADS total points 3   AFIRMA molecular testing performed on the right thyroid nodule returned suspicious for cancer.   Patient had total thyroidectomy 11/10/2017:  Diagnosis Thyroid, thyroidectomy - PAPILLARY THYROID CARCINOMA, FOLLICULAR VARIANT, 4 CM. - TUMOR CONFINED WITHIN THYROID CAPSULE. - MARGINS NOT INVOLVED. - SEE ONCOLOGY TABLE.  THYROID GLAND: Procedure: Thyroidectomy. Tumor Focality: Unifocal. Tumor Site: Right lobe. Tumor Size: 4 x 3.2 x 2.4 cm. Histologic Type: Follicular variant of the papillary thyroid carcinoma. Margins: Free of tumor. Angioinvasion: No. Lymphatic Invasion: No. Extrathyroidal: No. Regional Lymph Nodes: No lymph nodes submitted or found. Pathologic Stage Classification (pTNM, AJCC 8th  Edition): pT2, pNx Representative tumor block: 1D. Comment(s): The nodule in the right lobe has scattered foci with nuclear cytologic features consistent with papillary thyroid carcinoma, follicular variant. There is a 6.2 cm nodule in the left lobe which has follicular architecture and scattered foci of cytologic atypia and is consistent with a follicular neoplasm without capsular or vascular invasion. There is a smaller 0.9 cm nodule in the isthmus with features of a colloid nodule. (JDP:ah 11/11/17)  Neck U/S (12/21/2018): Post total thyroidectomy without evidence of residual or locally recurrent disease.  Reviewed his thyroglobulin and ATA antibodies: Lab Results  Component Value Date   THYROGLB 0.2 (L) 01/07/2020   THYROGLB 0.4 (L) 11/26/2019   THYROGLB 0.3 (L) 11/23/2018   THYROGLB 0.2 (L) 06/12/2018    Lab Results  Component Value Date   THGAB <1.0 01/07/2020   THGAB 2 (H) 11/26/2019   THGAB <1 11/23/2018   THGAB <1 06/12/2018    Pt denies: - feeling nodules in neck - hoarseness - dysphagia - choking - SOB with lying down  Pt is on levothyroxine 100 mcg daily (increased at last visit), taken: - in am - fasting - at the same time: coffee (black) - at least 30 min from b'fast - no calcium - no iron - no multivitamins - no PPIs Off vitamin C and Zinc.   Reviewed her TFTs: Lab Results  Component Value Date   TSH 0.51 01/07/2020   TSH 2.03 11/26/2019   TSH 0.44 11/23/2018  TSH 0.52 06/12/2018   TSH 1.01 12/10/2017   TSH 0.85 05/20/2017   FREET4 1.25 01/07/2020   FREET4 1.23 11/26/2019   FREET4 1.28 11/23/2018   FREET4 1.30 06/12/2018   FREET4 1.10 12/10/2017   FREET4 0.80 05/20/2017  03/26/2016: TSH 1.62, free T4 1.19, free T3 3.53  03/20/2015: TSH 0.84, free T4 0.83, free T3 2.9  + FH of thyroid ds in mother, who had to have her thyroid removed (not cancer).  + FH of thyroid cancer in sister. No h/o radiation tx to head or neck.  No herbal  supplements. No Biotin use. No recent steroids use.   After the surgery, she recovered and restarted running - running 7 mi 4 days a week on ave. She had hair loss, which resolved after sx.  ROS: + see HPI  I reviewed pt's medications, allergies, PMH, social hx, family hx, and changes were documented in the history of present illness. Otherwise, unchanged from my initial visit note.  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   Chronic low back pain 11/08/2014   Perimenopausal disorder 11/08/2014   Nontoxic multinodular goiter 11/08/2014   Past Surgical History:  Procedure Laterality Date   COLONOSCOPY     THYROIDECTOMY N/A 11/10/2017   Procedure: TOTAL THYROIDECTOMY;  Surgeon: Darnell Level, MD;  Location: [facility] OR;  Service: General;  Laterality: N/A;   VARICOSE VEIN SURGERY Bilateral    WISDOM TOOTH EXTRACTION       Social History   Social History   Marital status: Married    Spouse name: N/A   Number of children: 0   Occupational History   n/a   Social History Main Topics   Smoking status: Never Smoker   Smokeless tobacco: Never Used   Alcohol use occasional   Drug use: no   Current Outpatient Medications  Medication Sig Dispense Refill   EVENING PRIMROSE OIL PO Take 1 capsule by mouth See admin instructions. Take 1 capsule by mouth 5 times weekly     ibuprofen (ADVIL,MOTRIN) 200 MG tablet Take 400 mg by mouth every 6 (six) hours as needed for headache, mild pain or moderate pain.      levothyroxine (SYNTHROID)  100 MCG tablet TAKE 1 TABLET(100 MCG) BY MOUTH DAILY 90 tablet 1   OVER THE COUNTER MEDICATION Take 1 tablet by mouth daily. Nurturlax with Aloe Supplement     Probiotic Product (PROBIOTIC PO) Take 1 capsule by mouth daily.     No current facility-administered medications for this visit.   No Known Allergies   Family History  Problem Relation Age of Onset   Hypertension Mother    Thyroid disease Mother    Diabetes Father    Renal Disease Father    Thyroid disease Sister    Hypertension Maternal Uncle    Heart disease Maternal Uncle      PE: BP 120/82 (BP Location: Right Arm, Patient Position: Sitting, Cuff Size: Normal)   Pulse 64   Ht 5\' 6"  (1.676 m)   Wt 132 lb (59.9 kg)  SpO2 95%   BMI 21.31 kg/m  Wt Readings from Last 3 Encounters:  12/01/20 132 lb (59.9 kg)  11/26/19 131 lb (59.4 kg)  11/23/18 135 lb (61.2 kg)   Constitutional: normal weight, in NAD Eyes: PERRLA, EOMI, no exophthalmos ENT: moist mucous membranes, no neck masses palpated, surgical scar healed, no cervical lymphadenopathy Cardiovascular: RRR, No MRG Respiratory: CTA B Gastrointestinal: abdomen soft, NT, ND, BS+ Musculoskeletal: no deformities, strength intact in all 4 Skin: moist, warm, no rashes Neurological: no tremor with outstretched hands, DTR normal in all 4  ASSESSMENT: 1.  Papillary thyroid cancer, follicular variant -Family history of thyroid cancer in her sister (recent diagnosis)  2.  Postsurgical hypothyroidism  PLAN: 1.PTC -Follicular variant -She has a history of nontoxic goiter, with 3 dominant nodules. The isthmic thyroid nodule was biopsied with inconclusive results, but the Afirma molecular marker was suspicious. She had total thyroidectomy in 10/2017 by Dr. Harlow Asa. Final diagnosis was follicular variant of papillary thyroid cancer, 4 cm (stage II TNM path).  The cancer was confined to the thyroid capsule and margins were not involved.  She did not have lymphovascular invasion.   Because she was <27 years old at the time of diagnosis, she qualifies as stage I TNM clinically, which has a very good prognosis.  Therefore, we did not proceed with RAI remnant ablation. -We checked a neck ultrasound 12/21/2018: No abnormal masses -At last visit, she again had undetectable ATA's and a very low thyroglobulin.  Thyroglobulin should not necessarily be undetectable since she did not have RAI thyroid remnant ablation. -We will repeat these now -RTC in 1 year  2.  Postsurgical hypothyroidism - latest thyroid labs reviewed with pt. >> normal: Lab Results  Component Value Date   TSH 0.51 01/07/2020  - she continues on LT4 100 mcg daily (increased at last visit to target a lower TSH) - pt feels good on this dose.  She continues to have hot flashes, but these do not appear to be related to the increase in levothyroxine dose.  She also mentions that she lost approximately 10 pounds after we increase the dose at last visit but immediately gained back and weight has been stable since. - we discussed about taking the thyroid hormone every day, with water, >30 minutes before breakfast, separated by >4 hours from acid reflux medications, calcium, iron, multivitamins. Pt. is taking it correctly. - will check thyroid tests today: TSH and fT4 - If labs are abnormal, she will need to return for repeat TFTs in 1.5 months  Component     Latest Ref Rng & Units 12/01/2020  TSH     0.35 - 5.50 uIU/mL 0.09 (L)  T4,Free(Direct)     0.60 - 1.60 ng/dL 1.35  TSH is not too low.  I would suggest to alternate 88 with 100 mcg every other day of levothyroxine and recheck her test in 1.5 months.  Another option would be to take the 100 mcg 6 out of 7 days and only half a tablet 1 out of 7 days-states she told me at the time of the visit that she already refilled her prescription for the higher dose of levothyroxine.  Component     Latest Ref Rng & Units 12/01/2020  Thyroglobulin Antibody     0.0 - 0.9  IU/mL <1.0  Thyroglobulin by IMA     1.5 - 38.5 ng/mL 0.3 (L)  Thyroglobulin very low, only slightly higher than before, most likely due to the variability in the  assay, ATA undetectable.  [name], MD PhD [facility]

## 2020-12-01 NOTE — Patient Instructions (Signed)
Please continue Levothyroxine 100 mcg daily. ° °Take the thyroid hormone every day, with water, at least 30 minutes before breakfast, separated by at least 4 hours from: °- acid reflux medications °- calcium °- iron °- multivitamins ° °Please stop at the lab. ° °Please return in 1 year.  ° °

## 2020-12-02 LAB — TGAB+THYROGLOBULIN IMA OR LCMS: Thyroglobulin Antibody: <1 IU/mL (ref 0.0–0.9)

## 2020-12-02 LAB — THYROGLOBULIN BY IMA: Thyroglobulin by IMA: 0.3 ng/mL — ABNORMAL LOW (ref 1.5–38.5)

## 2021-01-05 ENCOUNTER — Other Ambulatory Visit: Payer: Self-pay | Admitting: Internal Medicine

## 2021-01-05 ENCOUNTER — Other Ambulatory Visit: Payer: Self-pay

## 2021-01-05 ENCOUNTER — Other Ambulatory Visit (INDEPENDENT_AMBULATORY_CARE_PROVIDER_SITE_OTHER): Payer: 59

## 2021-01-05 DIAGNOSIS — E890 Postprocedural hypothyroidism: Secondary | ICD-10-CM | POA: Diagnosis not present

## 2021-01-05 LAB — TSH: TSH: 0.18 u[IU]/mL — ABNORMAL LOW (ref 0.35–5.50)

## 2021-01-05 LAB — T4, FREE: Free T4: 1.38 ng/dL (ref 0.60–1.60)

## 2021-01-05 MED ORDER — LEVOTHYROXINE SODIUM 88 MCG PO TABS: 88.0000 ug | ORAL_TABLET | Freq: Every day | ORAL | 5 refills | Status: DC

## 2021-02-16 ENCOUNTER — Other Ambulatory Visit (INDEPENDENT_AMBULATORY_CARE_PROVIDER_SITE_OTHER): Payer: 59

## 2021-02-16 ENCOUNTER — Other Ambulatory Visit: Payer: Self-pay

## 2021-02-16 DIAGNOSIS — E890 Postprocedural hypothyroidism: Secondary | ICD-10-CM | POA: Diagnosis not present

## 2021-02-16 LAB — TSH: TSH: 0.7 u[IU]/mL (ref 0.35–5.50)

## 2021-02-16 LAB — T4, FREE: Free T4: 1.15 ng/dL (ref 0.60–1.60)

## 2021-05-04 ENCOUNTER — Other Ambulatory Visit: Payer: Self-pay

## 2021-05-04 DIAGNOSIS — E890 Postprocedural hypothyroidism: Secondary | ICD-10-CM

## 2021-05-04 MED ORDER — LEVOTHYROXINE SODIUM 88 MCG PO TABS: 88.0000 ug | ORAL_TABLET | Freq: Every day | ORAL | 1 refills | Status: DC

## 2021-05-04 NOTE — Telephone Encounter (Signed)
Pt requested 90 day supply of levothyroxine. Rx updated and sent to preferred pharmacy. ?

## 2021-12-07 ENCOUNTER — Encounter: Payer: Self-pay | Admitting: Internal Medicine

## 2021-12-07 ENCOUNTER — Ambulatory Visit (INDEPENDENT_AMBULATORY_CARE_PROVIDER_SITE_OTHER): Payer: 59 | Admitting: Internal Medicine

## 2021-12-07 VITALS — BP 124/82 | HR 85 | Ht 66.0 in | Wt 128.0 lb

## 2021-12-07 DIAGNOSIS — E890 Postprocedural hypothyroidism: Secondary | ICD-10-CM

## 2021-12-07 DIAGNOSIS — C73 Malignant neoplasm of thyroid gland: Secondary | ICD-10-CM | POA: Diagnosis not present

## 2021-12-07 LAB — T4, FREE: Free T4: 1.53 ng/dL (ref 0.60–1.60)

## 2021-12-07 LAB — TSH: TSH: 0.07 u[IU]/mL — ABNORMAL LOW (ref 0.35–5.50)

## 2021-12-07 MED ORDER — LEVOTHYROXINE SODIUM 75 MCG PO TABS: 75.0000 ug | ORAL_TABLET | Freq: Every day | ORAL | 3 refills | Status: DC

## 2021-12-07 NOTE — Patient Instructions (Signed)
Please continue: - Levothyroxine 88 mcg daily.  Take the thyroid hormone every day, with water, at least 30 minutes before breakfast, separated by at least 4 hours from: - acid reflux medications - calcium - iron - multivitamins  Please stop at the lab.  Please return in 1 year.

## 2021-12-07 NOTE — Progress Notes (Addendum)
Patient ID: Jeanne Roberts, female   DOB: [birth year], 56 y.o.   MRN: [id number]   HPI  Jeanne Roberts is a 56 y.o.-year-old female, returning for follow-up for thyroid cancer and post surgical hypothyroidism. She previously saw several endocrinologists, last: Dr. Meredith Pel.  Last visit with me 1 year ago. PCP: Dr Theodis Sato  Interim hx: No problems swallowing, choking, any masses felt in neck.  Reviewed her thyroid cancer history:  She was dx'ed with thyroid nodules in 2005, after a thyroid nodule was found by palpation.  Thyroid nodule Bx (05/24/2003):  - Left: benign - Right: benign  Thyroid U/S (11/17/2014): Right thyroid lobe: 64 x 30 x 31 mm. 46 x 27 x 37 mm complex mostly solid mass, inferior pole. Left thyroid lobe: 65 x 38 x 39 mm. Somewhat lobular 53 x 26 x 42 mm solid hyperemic mass, inferior pole. Isthmus Thickness: 6 mm.  24 x 17 x 20 mm complex cyst, right isthmus Lymphadenopathy: None visualized. Bilateral calcified carotid plaque is incidentally noted, without assessment for degree of stenosis. IMPRESSION: 1. Thyromegaly with right, left, and isthmic nodules which all meet consensus criteria for biopsy.  Thyroid nodule Bx (01/31/2015): - isthmus nodule: scant follicular epithelium: bethesda categ 1 - L thyroid nodule: benign  Thyroid U/S (07/10/2015): Right thyroid lobe: 7.2 x 3.2 x 3.6 cm. Heterogeneous lower pole nodule measured 2.8 x 4.9 x 2.5 cm and previously measured 4.6 x 2.7 x 3.7 cm. Subcentimeter right upper pole nodules are noted. Left thyroid lobe: 7.3 x 3.4 x 4.5 cm. Solid heterogeneous lower pole nodule measures 4.4 x 5.2 x 2.9 cm and previously measured up to 5.3 cm. Subcentimeter left upper pole nodules are noted. Isthmus Thickness: 0.8 cm. Hypoechoic nodule measures 1.9 x 1.4 x 2.1 cm and previously measured up to 2.4 cm. Lymphadenopathy: None visualized.   IMPRESSION: Bilateral and isthmic nodules are not significantly changed as described above. The  largest is on the left and measures up to 5.2 cm.  Thyroid U/S (11/08/2016):  Parenchymal Echotexture: Moderately heterogenous Right lobe: 7 x 2.7 x 3.3 cm, previously 7.2 x 3.2 x 3.6.  Right inferior nodule: 4.2 x 2 x 3.1 cm, previously 4.9 x 2.5 x 2.8 cm, mixed cystic and solid, hypoechoic.  Not previously biopsied  Left lobe: 7 x 3.1 x 4 cm, previously 7.3 x 3.4 x 4.5.  Lobular mid left nodule: 5.7 x 2.5 x 4.8 cm, previously 5.2 x 2.9 x 4.4; this was previously biopsied.  Isthmus: 0.9 cm thickness, previously 0.8 cm.  Very hypoechoic isthmic nodule: 1.5 x 0.8 x 1 cm, previously 2.1 x1.4 x 1.9; this was previously biopsied but with scant material.   IMPRESSION: 1. Thyromegaly with little change in bilateral and isthmic nodules. 2. Recommend FNA biopsy of mildly suspicious 4.2 cm inferior right nodule.  Plan was:  Notes recorded by [name], MD on 11/13/2016 at 12:19 PM EDT .Marland KitchenMarland KitchenHer left thyroid nodule is slightly larger, with the largest dimension increasing from 5.2 cm to 5.7 cm. This nodule was biopsied twice, including last year, and is benign. However, per my understanding, she has more neck compression symptoms and I am curious whether she is interested in thyroidectomy.  The other 2 nodules are slightly smaller, which is very good news: The isthmic nodule has been previously biopsied but the biopsy was inconclusive. If she does not desire thyroidectomy, we will need to rebiopsy this nodule and most likely also the right thyroid nodule, since this is still large, although, again,  decreased in size slightly since last check.  She continued to have neck compression symptoms >> We decided to rebiopsy the right and the isthmic thyroid nodules:  THYROID, FINE NEEDLE ASPIRATION ISTHMUS (SPECIMEN 1 OF 2, COLLECTED ON 03/23/4399): SCANT FOLLICULAR EPITHELIUM PRESENT (BETHESDA CATEGORY I). [name] MD Pathologist, Electronic Signature (Case signed 08/22/2017) Specimen Clinical  Information 1.5 x 0.8 x 1cm very hypoechoic isthmic nodule, previously 2.1 x 1.4 x 1.9; this was previoulsy biopsied, Previous biopsy 1/10/17[name] Thyroid, Fine Needle Aspiration, Isthmus, (Specimen 1 of 2, collected on 08/21/2017)  Adequacy Reason Satisfactory For Evaluation. Diagnosis THYROID, FINE NEEDLE ASPIRATION RIGHT (SPECIMEN 2 OF 2, COLLECTED ON 08/21/2017): ATYPIA OF UNDETERMINED SIGNIFICANCE OR FOLLICULAR LESION OF UNDETERMINED SIGNIFICANCE (BETHESDA CATEGORY III). COMMENT: THIS SPECIMEN WILL BE SENT FOR AFIRMA TESTING. [name] MD Pathologist, Electronic Signature (Case signed 08/22/2017) Specimen Clinical Information Right inferior 4.2cm; Other 2 dimensions: 2 x 3cm, previously 4.9 x 2.5 x 2.8cm, Mixed cystic and solid, Hypoechoic, TI-RADS total points 3   AFIRMA molecular testing performed on the right thyroid nodule returned suspicious for cancer.   Patient had total thyroidectomy 11/10/2017:  Diagnosis Thyroid, thyroidectomy - PAPILLARY THYROID CARCINOMA, FOLLICULAR VARIANT, 4 CM. - TUMOR CONFINED WITHIN THYROID CAPSULE. - MARGINS NOT INVOLVED. - SEE ONCOLOGY TABLE.  THYROID GLAND: Procedure: Thyroidectomy. Tumor Focality: Unifocal. Tumor Site: Right lobe. Tumor Size: 4 x 3.2 x 2.4 cm. Histologic Type: Follicular variant of the papillary thyroid carcinoma. Margins: Free of tumor. Angioinvasion: No. Lymphatic Invasion: No. Extrathyroidal: No. Regional Lymph Nodes: No lymph nodes submitted or found. Pathologic Stage Classification (pTNM, AJCC 8th Edition): pT2, pNx Representative tumor block: 1D. Comment(s): The nodule in the right lobe has scattered foci with nuclear cytologic features consistent with papillary thyroid carcinoma, follicular variant. There is a 6.2 cm nodule in the left lobe which has follicular architecture and scattered foci of cytologic atypia and is consistent with a follicular neoplasm without capsular or vascular  invasion. There is a smaller 0.9 cm nodule in the isthmus with features of a colloid nodule. (JDP:ah 11/11/17)  Neck U/S (12/21/2018): Post total thyroidectomy without evidence of residual or locally recurrent disease.  Reviewed his thyroglobulin and ATA antibodies: Lab Results  Component Value Date   THYROGLB 0.3 (L) 12/01/2020   THYROGLB 0.2 (L) 01/07/2020   THYROGLB 0.4 (L) 11/26/2019   THYROGLB 0.3 (L) 11/23/2018   THYROGLB 0.2 (L) 06/12/2018    Lab Results  Component Value Date   THGAB <1.0 12/01/2020   THGAB <1.0 01/07/2020   THGAB 2 (H) 11/26/2019   THGAB <1 11/23/2018   THGAB <1 06/12/2018    Pt denies: - feeling nodules in neck - hoarseness - dysphagia - choking  Pt is on levothyroxine 88 mcg days: - in am - fasting - at the same time: coffee (black) - added Creating in coffee - 05/2021 - at least 30 min from b'fast - no calcium - no iron - no multivitamins - no PPIs Off vitamin C and Zinc.   Reviewed her TFTs: Lab Results  Component Value Date   TSH 0.70 02/16/2021   TSH 0.18 (L) 01/05/2021   TSH 0.09 (L) 12/01/2020   TSH 0.51 01/07/2020   TSH 2.03 11/26/2019   TSH 0.44 11/23/2018   TSH 0.52 06/12/2018   TSH 1.01 12/10/2017   TSH 0.85 05/20/2017   FREET4 1.15 02/16/2021   FREET4 1.38 01/05/2021   FREET4 1.35 12/01/2020   FREET4 1.25 01/07/2020   FREET4 1.23 11/26/2019  FREET4 1.28 11/23/2018   FREET4 1.30 06/12/2018   FREET4 1.10 12/10/2017   FREET4 0.80 05/20/2017  03/26/2016: TSH 1.62, free T4 1.19, free T3 3.53  03/20/2015: TSH 0.84, free T4 0.83, free T3 2.9  + FH of thyroid ds in mother, who had to have her thyroid removed (not cancer).  + FH of thyroid cancer in sister. No h/o radiation tx to head or neck. No herbal supplements. No Biotin use. No recent steroids use.   After the surgery, she recovered and restarted running - running 7 mi 4 days a week on ave. She had hair loss, which resolved after sx.  She is from [facility].  ROS: + see HPI  I reviewed pt's medications, allergies, PMH, social hx, family hx, and changes were documented in the history of present illness. Otherwise, unchanged from my initial visit note.  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 10/04/2015   Chronic left shoulder pain 04/18/2015   Degenerative disc disease, cervical 03/20/2015   Melanosis coli 03/20/2015   Chronic low back pain 11/08/2014   Perimenopausal disorder 11/08/2014   Nontoxic multinodular goiter 11/08/2014   Past Surgical History:  Procedure Laterality Date   COLONOSCOPY     THYROIDECTOMY N/A 11/10/2017   Procedure: TOTAL THYROIDECTOMY;  Surgeon: Armandina Gemma, MD;  Location: [facility];  Service: General;  Laterality: N/A;   VARICOSE VEIN SURGERY Bilateral    WISDOM TOOTH EXTRACTION       Social History   Social History   Marital status: Married    Spouse name: N/A   Number of children: 0   Occupational History   n/a   Social History Main Topics   Smoking status: Never Smoker   Smokeless tobacco: Never Used   Alcohol use occasional   Drug use: no   Current Outpatient Medications  Medication Sig Dispense Refill   EVENING PRIMROSE OIL PO Take 1 capsule by mouth See admin instructions. Take 1 capsule by mouth 5 times weekly     ibuprofen (ADVIL,MOTRIN) 200 MG tablet Take 400 mg by mouth every 6 (six) hours as needed for headache, mild pain or moderate pain.      levothyroxine (SYNTHROID) 88 MCG tablet Take 1 tablet (88 mcg total) by mouth daily before breakfast. Take 1 tablet 6 out of 7 days and only half a tablet in 1 out of 7 days, by mouth, before breakfast 90  tablet 1   OVER THE COUNTER MEDICATION Take 1 tablet by mouth daily. Nurturlax with Aloe Supplement     Probiotic Product (PROBIOTIC PO) Take 1 capsule by mouth daily.     No current facility-administered medications for this visit.   No Known Allergies   Family History  Problem Relation Age of Onset   Hypertension Mother    Thyroid disease Mother    Diabetes Father    Renal Disease Father    Thyroid disease Sister    Hypertension Maternal Uncle    Heart disease Maternal Uncle      PE: BP 124/82 (BP Location: Right Arm, Patient Position: Sitting, Cuff Size: Normal)   Pulse 85   Ht _0  (1.676 m)  Wt 128 lb (58.1 kg)   SpO2 99%   BMI 20.66 kg/m  Wt Readings from Last 3 Encounters:  12/07/21 128 lb (58.1 kg)  12/01/20 132 lb (59.9 kg)  11/26/19 131 lb (59.4 kg)   Constitutional: normal weight, in NAD Eyes: EOMI, no exophthalmos ENT: no neck masses palpated, surgical scar healed, no cervical lymphadenopathy Cardiovascular: RRR, No MRG Respiratory: CTA B Musculoskeletal: no deformities Skin: moist, warm, no rashes Neurological: no tremor with outstretched hands  ASSESSMENT: 1.  Papillary thyroid cancer, follicular variant -Family history of thyroid cancer in her sister (recent diagnosis)  2.  Postsurgical hypothyroidism  PLAN: Follicular variant of PTC - She has a history of nontoxic goiter, with 3 dominant nodules. The isthmic thyroid nodule was biopsied with inconclusive results, but the Afirma molecular marker was suspicious. She had total thyroidectomy in 10/2017 by Dr. Harlow Asa. Final diagnosis was follicular variant of papillary thyroid cancer, 4 cm (stage II TNM path).  The cancer was confined to the thyroid capsule and margins were not involved.  Also, she did not have lymphovascular invasion.  Due to the young age at diagnosis, she was qualified as stage I TNM clinically, which confers a good prognosis.  4, we did not proceed with RAI remnant ablation -We  checked a neck ultrasound in 11/2018: No abnormal masses. -At last visit, her thyroglobulin was slightly higher, at 0.3, with undetectable thyroglobulin antibodies.  We discussed that thyroglobulin should not necessarily be undetectable since she did not have RAI thyroid remnant ablation -We will repeat these now -Plan to repeat another ultrasound probably 5 years from the previous  2.  Postsurgical hypothyroidism - latest thyroid labs reviewed with pt. >> normal: Lab Results  Component Value Date   TSH 0.70 02/16/2021  - she was on LT4 100 mcg 6/7 days (decreased at last visit) >> now 88 mcg daily - pt feels good on this dose.  She lost 4 pounds since last visit per our scale. - we discussed about taking the thyroid hormone every day, with water, >30 minutes before breakfast, separated by >4 hours from acid reflux medications, calcium, iron, multivitamins. Pt. is taking it correctly. She added creatine to her coffee -this should not have any influence the absorption of her levothyroxine, but we will see if she needs to move this later in the day depending on the labs. - will check thyroid tests today: TSH and fT4 - If labs are abnormal, she will need to return for repeat TFTs in 1.5 months - OTW, I will see her back in a year  Needs 90 day refills.  Component     Latest Ref Rng 12/07/2021  T4,Free(Direct)     0.60 - 1.60 ng/dL 1.53   TSH     0.35 - 5.50 uIU/mL 0.07 (L)   TSH is again suppressed.  This is surprising, since she is on a relatively low dose of levothyroxine.  I will advise her to decrease the dose to 75 mcg daily and repeat the tests in 2 months (she will be out of town before January).  [name], MD PhD [facility] Endocrinology

## 2021-12-10 LAB — THYROGLOBULIN LEVEL: Thyroglobulin: 0.2 ng/mL — ABNORMAL LOW

## 2021-12-10 LAB — THYROGLOBULIN ANTIBODY: Thyroglobulin Ab: <1 IU/mL (ref <=1)

## 2022-02-04 ENCOUNTER — Other Ambulatory Visit (INDEPENDENT_AMBULATORY_CARE_PROVIDER_SITE_OTHER): Payer: 59

## 2022-02-04 DIAGNOSIS — E890 Postprocedural hypothyroidism: Secondary | ICD-10-CM | POA: Diagnosis not present

## 2022-02-04 LAB — T4, FREE: Free T4: 0.98 ng/dL (ref 0.60–1.60)

## 2022-02-04 LAB — TSH: TSH: 9.98 u[IU]/mL — ABNORMAL HIGH (ref 0.35–5.50)

## 2022-02-04 MED ORDER — LEVOTHYROXINE SODIUM 75 MCG PO TABS: 75.0000 ug | ORAL_TABLET | ORAL | 3 refills | Status: DC

## 2022-02-04 MED ORDER — LEVOTHYROXINE SODIUM 88 MCG PO TABS: 88.0000 ug | ORAL_TABLET | ORAL | 3 refills | Status: DC

## 2022-03-20 ENCOUNTER — Other Ambulatory Visit (INDEPENDENT_AMBULATORY_CARE_PROVIDER_SITE_OTHER): Payer: 59

## 2022-03-20 DIAGNOSIS — E890 Postprocedural hypothyroidism: Secondary | ICD-10-CM

## 2022-03-20 LAB — T4, FREE: Free T4: 1.1 ng/dL (ref 0.60–1.60)

## 2022-03-20 LAB — TSH: TSH: 1.02 u[IU]/mL (ref 0.35–5.50)

## 2022-03-26 ENCOUNTER — Other Ambulatory Visit: Payer: Self-pay

## 2022-03-26 DIAGNOSIS — E890 Postprocedural hypothyroidism: Secondary | ICD-10-CM

## 2022-03-26 MED ORDER — LEVOTHYROXINE SODIUM 75 MCG PO TABS: 75.0000 ug | ORAL_TABLET | ORAL | 3 refills | Status: DC

## 2022-03-26 MED ORDER — LEVOTHYROXINE SODIUM 88 MCG PO TABS: 88.0000 ug | ORAL_TABLET | ORAL | 3 refills | Status: DC

## 2022-12-02 ENCOUNTER — Encounter: Payer: Self-pay | Admitting: Internal Medicine

## 2022-12-02 ENCOUNTER — Ambulatory Visit (INDEPENDENT_AMBULATORY_CARE_PROVIDER_SITE_OTHER): Payer: PRIVATE HEALTH INSURANCE | Admitting: Internal Medicine

## 2022-12-02 VITALS — BP 120/70 | HR 67 | Ht 66.0 in | Wt 138.4 lb

## 2022-12-02 DIAGNOSIS — E890 Postprocedural hypothyroidism: Secondary | ICD-10-CM | POA: Diagnosis not present

## 2022-12-02 DIAGNOSIS — C73 Malignant neoplasm of thyroid gland: Secondary | ICD-10-CM | POA: Diagnosis not present

## 2022-12-02 LAB — T4, FREE: Free T4: 0.84 ng/dL (ref 0.60–1.60)

## 2022-12-02 LAB — TSH: TSH: 7.12 u[IU]/mL — ABNORMAL HIGH (ref 0.35–5.50)

## 2022-12-02 NOTE — Progress Notes (Addendum)
Patient ID: Jeanne Roberts, female   DOB: 08-12-1965, 57 y.o.   MRN: [id number]   HPI  Jeanne Roberts is a 57 y.o.-year-old female, returning for follow-up for thyroid cancer and post surgical hypothyroidism. Last visit with me 1 year ago. PCP: Dr Alwyn Ren  Interim hx: No problems swallowing, choking, neck pressure. She does mention weight gain since last visit - she has increased appetite.  Reviewed her thyroid cancer history:  She was dx'ed with thyroid nodules in 2005, after a thyroid nodule was found by palpation.  ... See previous notes: she has thyroid ultrasound and biopsies over the years, latest biopsy with positive Afirma   Total thyroidectomy 11/10/2017:  Diagnosis Thyroid, thyroidectomy - PAPILLARY THYROID CARCINOMA, FOLLICULAR VARIANT, 4 CM. - TUMOR CONFINED WITHIN THYROID CAPSULE. - MARGINS NOT INVOLVED. - SEE ONCOLOGY TABLE.  THYROID GLAND: Procedure: Thyroidectomy. Tumor Focality: Unifocal. Tumor Site: Right lobe. Tumor Size: 4 x 3.2 x 2.4 cm. Histologic Type: Follicular variant of the papillary thyroid carcinoma. Margins: Free of tumor. Angioinvasion: No. Lymphatic Invasion: No. Extrathyroidal: No. Regional Lymph Nodes: No lymph nodes submitted or found. Pathologic Stage Classification (pTNM, AJCC 8th Edition): pT2, pNx Representative tumor block: 1D. Comment(s): The nodule in the right lobe has scattered foci with nuclear cytologic features consistent with papillary thyroid carcinoma, follicular variant. There is a 6.2 cm nodule in the left lobe which has follicular architecture and scattered foci of cytologic atypia and is consistent with a follicular neoplasm without capsular or vascular invasion. There is a smaller 0.9 cm nodule in the isthmus with features of a colloid nodule. (JDP:ah 11/11/17)  Neck U/S (12/21/2018): Post total thyroidectomy without evidence of residual or locally recurrent disease.  Reviewed his thyroglobulin and ATA  antibodies: Lab Results  Component Value Date   THYROGLB 0.2 (L) 12/07/2021   THYROGLB 0.3 (L) 12/01/2020   THYROGLB 0.2 (L) 01/07/2020   THYROGLB 0.4 (L) 11/26/2019   THYROGLB 0.3 (L) 11/23/2018   THYROGLB 0.2 (L) 06/12/2018    Lab Results  Component Value Date   THGAB <1 12/07/2021   THGAB <1.0 12/01/2020   THGAB <1.0 01/07/2020   THGAB 2 (H) 11/26/2019   THGAB <1 11/23/2018   THGAB <1 06/12/2018    Pt denies: - feeling nodules in neck - hoarseness - dysphagia - choking  Pt is on levothyroxine 88 alternating with 75 mcg every other day -dose changed 01/2022  - in am - fasting - at the same time: coffee (black) - added Creatine in coffee - 05/2021 and now also protein powder in her second cup - at least 30 min from b'fast - no calcium - no iron - no multivitamins - no PPIs Off vitamin C and Zinc.   Reviewed her TFTs: Lab Results  Component Value Date   TSH 1.02 03/20/2022   TSH 9.98 (H) 02/04/2022   TSH 0.07 (L) 12/07/2021   TSH 0.70 02/16/2021   TSH 0.18 (L) 01/05/2021   TSH 0.09 (L) 12/01/2020   TSH 0.51 01/07/2020   TSH 2.03 11/26/2019   TSH 0.44 11/23/2018   TSH 0.52 06/12/2018   FREET4 1.10 03/20/2022   FREET4 0.98 02/04/2022   FREET4 1.53 12/07/2021   FREET4 1.15 02/16/2021   FREET4 1.38 01/05/2021   FREET4 1.35 12/01/2020   FREET4 1.25 01/07/2020   FREET4 1.23 11/26/2019   FREET4 1.28 11/23/2018   FREET4 1.30 06/12/2018  03/26/2016: TSH 1.62, free T4 1.19, free T3 3.53  03/20/2015: TSH 0.84, free T4 0.83, free T3 2.9  +  FH of thyroid ds in mother, who had to have her thyroid removed (not cancer).  + FH of thyroid cancer in sister. No h/o radiation tx to head or neck. No herbal supplements. No Biotin use. No recent steroids use.   After the surgery, she recovered and restarted running - running 7 mi 4 days a week on ave. She had hair loss, which resolved after sx.  She is from [facility].  ROS: + see HPI  I reviewed pt's  medications, allergies, PMH, social hx, family hx, and changes were documented in the history of present illness. Otherwise, unchanged from my initial visit note.  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   Chronic low back pain 11/08/2014   Perimenopausal disorder 11/08/2014   Nontoxic multinodular goiter 11/08/2014   Past Surgical History:  Procedure Laterality Date   COLONOSCOPY     THYROIDECTOMY N/A 11/10/2017   Procedure: TOTAL THYROIDECTOMY;  Surgeon: Darnell Level, MD;  Location: [facility];  Service: General;  Laterality: N/A;   VARICOSE VEIN SURGERY Bilateral    WISDOM TOOTH EXTRACTION       Social History   Social History   Marital status: Married    Spouse name: N/A   Number of children: 0   Occupational History   n/a   Social History Main Topics   Smoking status: Never Smoker   Smokeless tobacco: Never Used   Alcohol use occasional   Drug use: no   Current Outpatient Medications  Medication Sig Dispense Refill   EVENING PRIMROSE OIL PO Take 1 capsule by mouth See admin instructions. Take 1 capsule by mouth 5 times weekly     ibuprofen (ADVIL,MOTRIN) 200 MG tablet Take 400 mg by mouth every 6 (six) hours as needed for headache, mild pain or moderate pain.      levothyroxine (SYNTHROID) 75 MCG tablet Take 1 tablet (75 mcg total) by mouth every other day. 30 tablet 3   levothyroxine (SYNTHROID) 88 MCG tablet Take 1 tablet (88 mcg total) by mouth every other day. 30 tablet 3   OVER THE COUNTER MEDICATION Take 1  tablet by mouth daily. Nurturlax with Aloe Supplement     Probiotic Product (PROBIOTIC PO) Take 1 capsule by mouth daily.     No current facility-administered medications for this visit.   No Known Allergies   Family History  Problem Relation Age of Onset   Hypertension Mother    Thyroid disease Mother    Diabetes Father    Renal Disease Father    Thyroid disease Sister    Hypertension Maternal Uncle    Heart disease Maternal Uncle     PE: BP 120/70   Pulse 67   Ht 5\' 6"  (1.676 m)   Wt 138 lb 6.4 oz (62.8 kg)   SpO2 99%   BMI 22.34 kg/m  Wt Readings from Last 3 Encounters:  12/02/22 138 lb 6.4 oz (62.8 kg)  12/07/21 128 lb (58.1 kg)  12/01/20 132 lb (59.9 kg)   Constitutional: normal weight, in NAD Eyes: EOMI,  no exophthalmos ENT: no neck masses palpated, surgical scar healed, no cervical lymphadenopathy Cardiovascular: RRR, No MRG Respiratory: CTA B Musculoskeletal: no deformities Skin: moist, Roberts, no rashes Neurological: no tremor with outstretched hands  ASSESSMENT: 1.  Papillary thyroid cancer, follicular variant -Family history of thyroid cancer in her sister  2.  Postsurgical hypothyroidism  PLAN: Follicular variant of PTC - She has a history of nontoxic goiter, with 3 dominant nodules. The isthmic thyroid nodule was biopsied with inconclusive results, but the Afirma molecular marker was suspicious. She had total thyroidectomy in 10/2017 by Dr. Gerrit Friends. Final diagnosis was follicular variant of papillary thyroid cancer, 4 cm (stage II TNM path).  The cancer was confined to the thyroid capsule and margins were not involved.  Also, she did not have lymphovascular invasion.  Due to the young age at diagnosis, she was qualified stage I TNM clinically, which confers a good prognosis.  Therefore, we did not proceed with RAI remnant ablation -We checked a neck ultrasound 11/2018: No abnormal masses -Thyroglobulin was slightly increased in 2022, at 0.3, with  undetectable thyroglobulin antibodies.  We discussed that a certain degree of variation of thyroglobulin is unavoidable since she did not have RAI thyroid remnant ablation.  At last visit, thyroglobulin was 0.2, slightly lower.  -We will repeat these now -Plan to repeat another neck ultrasound 5 years from the previous - unless thyroglobulin is higher   2.  Postsurgical hypothyroidism - latest thyroid labs reviewed with pt. >> normal: Lab Results  Component Value Date   TSH 1.02 03/20/2022  - she was on LT4 75 mcg daily -decreased at last visit, however, TSH was high afterwards so she is now alternating 75 with 88 mcg every other day -she would prefer not to alternate doses if possible. - pt feels good on this dose, but per our scale, she gained 10 lbs since last OV - however, she has heavy clothes and shoes - we discussed about taking the thyroid hormone every day, with water, >30 minutes before breakfast, separated by >4 hours from acid reflux medications, calcium, iron, multivitamins. Pt. is taking it correctly. - will check thyroid tests today: TSH and fT4 - If labs are abnormal, she will need to return for repeat TFTs in 1.5 months - I will see her back in a year  Needs 90 day refills. Prefers not to alternate doses.  Component     Latest Ref Rng 12/02/2022  TSH     0.35 - 5.50 uIU/mL 7.12 (H)   T4,Free(Direct)     0.60 - 1.60 ng/dL 6.44   Thyroglobulin     ng/mL 0.8 (L)   Comment --   Thyroglobulin Ab     < or = 1 IU/mL <1   TSH is elevated.  Will advise her to increase the dose of levothyroxine back to 88 mcg daily and repeat the TFTs in 1.5 mo. Thyroglobulin is also higher, possibly due to the TSH being elevated. I will repeat the level at next lab draw but we will also check a neck ultrasound.  Neck U/S (12/11/2022): Isthmus: Surgically absent. There is no residual nodular soft tissue within the isthmic resection bed.   Right lobe: Surgically absent. There is no residual  nodular soft tissue within the right lobectomy resection bed.   Left lobe: Superior to the left thyroidectomy resection bed is a 0.7 x 0.7 cm hypoechoic nodule which contains internal calcifications (image 18). _________________________________________________________   No regional cervical lymphadenopathy.   There is  echogenic shadowing atherosclerotic plaque within the left carotid bulb.   IMPRESSION: Superior to the left thyroidectomy resection bed is a 0.7 cm hypoechoic nodule which contains internal calcifications, nonspecific though worrisome for residual or recurrent thyroid parenchyma given provided history of elevated thyroglobulin levels. As this tissue is ill-defined on the present examination, further evaluation with contrast-enhanced neck CT and/or nuclear medicine thyroid uptake and scan could be performed as indicated.  She has a 0.7 cm nodule in the left thyroidectomy site.  Will go ahead with whole-body scan.  If this is positive, we will perform RAI treatment.  Will check with the patient first.  Carlus Pavlov, MD PhD [facility] Endocrinology

## 2022-12-02 NOTE — Patient Instructions (Addendum)
Please continue: - Levothyroxine 88 alternating with 75 mcg every other day  Take the thyroid hormone every day, with water, at least 30 minutes before breakfast, separated by at least 4 hours from: - acid reflux medications - calcium - iron - multivitamins  Please stop at the lab.  Please return in 1 year.

## 2022-12-04 LAB — THYROGLOBULIN ANTIBODY: Thyroglobulin Ab: <1 [IU]/mL (ref <=1)

## 2022-12-04 LAB — THYROGLOBULIN LEVEL: Thyroglobulin: 0.8 ng/mL — ABNORMAL LOW

## 2022-12-05 MED ORDER — LEVOTHYROXINE SODIUM 88 MCG PO TABS: 88.0000 ug | ORAL_TABLET | Freq: Every day | ORAL | 3 refills | Status: DC

## 2022-12-09 ENCOUNTER — Ambulatory Visit: Payer: 59 | Admitting: Internal Medicine

## 2022-12-11 ENCOUNTER — Ambulatory Visit
Admission: RE | Admit: 2022-12-11 | Discharge: 2022-12-11 | Disposition: A | Discharge status: Home / Self Care | Payer: 59 | Source: Ambulatory Visit | Attending: Internal Medicine | Admitting: Internal Medicine

## 2022-12-11 DIAGNOSIS — C73 Malignant neoplasm of thyroid gland: Secondary | ICD-10-CM

## 2022-12-23 ENCOUNTER — Telehealth: Payer: Self-pay

## 2022-12-23 ENCOUNTER — Other Ambulatory Visit: Payer: Self-pay | Admitting: Internal Medicine

## 2022-12-23 ENCOUNTER — Encounter: Payer: Self-pay | Admitting: Internal Medicine

## 2022-12-23 DIAGNOSIS — C73 Malignant neoplasm of thyroid gland: Secondary | ICD-10-CM

## 2022-12-23 NOTE — Telephone Encounter (Signed)
I called and spoke with the patient and advised her of the answers below. She wants to know can she do the whole body scan before she leave and if she needs the Radioactive treatment can she do that when she returns?

## 2022-12-23 NOTE — Telephone Encounter (Signed)
Patient called because she has lots of questions.   Questions: What type of whole body scan is she going to have (PET?) Also patient went to see her Gyn and they noticed a Polyp on her Uterus and she wants to know will this whole body scan show it?  When it comes down to getting the High TSH are they going to administer the injection the same day as her scan? How soon does she need to get this done because [place].  Can she drive herself for the scan and for the Radioactive treatment does she need to stay away from others.  Please advise

## 2022-12-23 NOTE — Telephone Encounter (Signed)
Yes, definitely

## 2022-12-23 NOTE — Telephone Encounter (Signed)
Pt has agreed to do the scan before she leaves please place order.

## 2022-12-25 NOTE — Written Directive (Cosign Needed)
I-131 WHOLE BODY SCAN    RADIOPHARMACEUTICAL: Iodine-131 Capsule for Diagnostic Imaging   PRESCRIBED DOSE FOR ADMINISTRATION: 4 mCi   ROUTE OFADMINISTRATION: PO   DIAGNOSIS: Papillary Thyroid Carcinoma   REFERRING PHYSICIAN: Carlus Pavlov, MD   THYROGEN STIMULATION OR HORMONE WITHDRAW: Thyrogen Stimulated   DATE OF THYROIDECTOMY: 11/10/2017   SURGEON:   TSH:   Lab Results  Component Value Date   TSH 7.12 (H) 12/02/2022   TSH 1.02 03/20/2022   TSH 9.98 (H) 02/04/2022     PRIOR I-131 THERAPY (Date and Dose): None   ADDITIONAL PHYSICIAN COMMENTS/NOTES: She has a 0.7 cm nodule in the left thyroidectomy site. Will go ahead with whole-body scan. If this is positive, we will perform RAI treatment. Per Dr Elvera Lennox last ofc note.    AUTHORIZED USER SIGNATURE & TIME STAMP:  Patriciaann Clan, MD   12/26/22    8:20 AM

## 2022-12-30 ENCOUNTER — Encounter (HOSPITAL_COMMUNITY)
Admission: RE | Admit: 2022-12-30 | Discharge: 2022-12-30 | Disposition: A | Discharge status: Home / Self Care | Payer: 59 | Source: Ambulatory Visit | Attending: Internal Medicine | Admitting: Internal Medicine

## 2022-12-30 DIAGNOSIS — E890 Postprocedural hypothyroidism: Secondary | ICD-10-CM | POA: Insufficient documentation

## 2022-12-30 DIAGNOSIS — C73 Malignant neoplasm of thyroid gland: Secondary | ICD-10-CM | POA: Insufficient documentation

## 2022-12-30 MED ORDER — THYROTROPIN ALFA 0.9 MG IM SOLR
0.9000 mg | INTRAMUSCULAR | Status: AC
  Administered 2022-12-30: 0.9 mg via INTRAMUSCULAR

## 2022-12-30 MED ORDER — THYROTROPIN ALFA 0.9 MG IM SOLR
INTRAMUSCULAR | Status: AC
  Filled 2022-12-30: qty 0.9

## 2022-12-31 ENCOUNTER — Encounter (HOSPITAL_COMMUNITY)
Admission: RE | Admit: 2022-12-31 | Discharge: 2022-12-31 | Disposition: A | Discharge status: Home / Self Care | Payer: 59 | Source: Ambulatory Visit | Attending: Internal Medicine | Admitting: Internal Medicine

## 2022-12-31 DIAGNOSIS — C73 Malignant neoplasm of thyroid gland: Secondary | ICD-10-CM | POA: Diagnosis not present

## 2022-12-31 MED ORDER — THYROTROPIN ALFA 0.9 MG IM SOLR
0.9000 mg | INTRAMUSCULAR | Status: AC
  Administered 2022-12-31: 0.9 mg via INTRAMUSCULAR

## 2022-12-31 MED ORDER — THYROTROPIN ALFA 0.9 MG IM SOLR
INTRAMUSCULAR | Status: AC
Start: 2022-12-31 — End: ?
  Filled 2022-12-31: qty 0.9

## 2023-01-01 ENCOUNTER — Ambulatory Visit (HOSPITAL_COMMUNITY)
Admission: RE | Admit: 2023-01-01 | Discharge: 2023-01-01 | Disposition: A | Discharge status: Home / Self Care | Payer: PRIVATE HEALTH INSURANCE | Source: Ambulatory Visit | Attending: Internal Medicine | Admitting: Internal Medicine

## 2023-01-01 MED ORDER — SODIUM IODIDE I 131 CAPSULE
4.1000 | Freq: Once | INTRAVENOUS | Status: AC | PRN
  Administered 2023-01-01: 4.1 via ORAL

## 2023-01-03 ENCOUNTER — Encounter (HOSPITAL_COMMUNITY)
Admission: RE | Admit: 2023-01-03 | Discharge: 2023-01-03 | Disposition: A | Discharge status: Home / Self Care | Payer: 59 | Source: Ambulatory Visit | Attending: Internal Medicine | Admitting: Internal Medicine

## 2023-01-03 ENCOUNTER — Encounter: Payer: Self-pay | Admitting: Internal Medicine

## 2023-01-06 ENCOUNTER — Other Ambulatory Visit: Payer: PRIVATE HEALTH INSURANCE

## 2023-01-06 NOTE — Addendum Note (Signed)
Addended by: Pollie Meyer on: 01/06/2023 10:42 AM   Modules accepted: Orders

## 2023-01-06 NOTE — Telephone Encounter (Signed)
Patient came in for her labs today and requested a note be sent requesting a phone call to [phone]. Has concerns about body scan, radioactive treatment, cancelling travel plans, and a scheduled D and C. Patient did advise that communicating through MyChart regarding her concerns and questions is not going to be as robust and comprehensive as a call

## 2023-01-07 ENCOUNTER — Encounter: Payer: Self-pay | Admitting: Internal Medicine

## 2023-01-07 ENCOUNTER — Ambulatory Visit: Payer: PRIVATE HEALTH INSURANCE | Admitting: Internal Medicine

## 2023-01-07 ENCOUNTER — Telehealth: Payer: Self-pay

## 2023-01-07 VITALS — BP 130/82 | HR 88 | Ht 66.0 in | Wt 134.4 lb

## 2023-01-07 DIAGNOSIS — C73 Malignant neoplasm of thyroid gland: Secondary | ICD-10-CM

## 2023-01-07 DIAGNOSIS — E890 Postprocedural hypothyroidism: Secondary | ICD-10-CM

## 2023-01-07 LAB — T4, FREE: Free T4: 1.5 ng/dL (ref 0.8–1.8)

## 2023-01-07 LAB — THYROGLOBULIN LEVEL: Thyroglobulin: 3.3 ng/mL

## 2023-01-07 LAB — TSH: TSH: 5.77 m[IU]/L — ABNORMAL HIGH (ref 0.40–4.50)

## 2023-01-07 LAB — THYROGLOBULIN ANTIBODY: Thyroglobulin Ab: <1 [IU]/mL (ref <=1)

## 2023-01-07 MED ORDER — LEVOTHYROXINE SODIUM 100 MCG PO TABS: 100.0000 ug | ORAL_TABLET | Freq: Every day | ORAL | Status: DC

## 2023-01-07 NOTE — Progress Notes (Signed)
Patient ID: Jeanne Roberts, female   DOB: 1965-08-08, 57 y.o.   MRN: [id number]   HPI  Jeanne Roberts is a 57 y.o.-year-old female, returning for follow-up for thyroid cancer and post surgical hypothyroidism. Last visit with me 1 month ago.  She returns as she has questions about her treatment plan. PCP: Dr Jeanne Roberts  Interim hx: After last visit, I sent her a message about the results of her thyroid ultrasound and I recommended a whole-body scan and subsequently RAI treatment.  She comes today to discuss her treatment plan -we scheduled this appointment to answer her questions. She is leaving to [place]. No problems swallowing, choking, neck pressure. She recently had increased pelvic pain and had an ultrasound that showed a thickened endometrium.  She has a D&C planned for the end of next month.  She had an endometrial biopsy attempted in [place] office yesterday but she had a syncopal episode during the procedure. She is under a lot of stress with both her thyroid cancer and the above problem.  Reviewed and addended her thyroid cancer history:  She was dx'ed with thyroid nodules in 2005, after a thyroid nodule was found by palpation.  ... See previous notes: she has thyroid ultrasound and biopsies over the years, latest biopsy with positive Afirma   Total thyroidectomy 11/10/2017:  Diagnosis Thyroid, thyroidectomy - PAPILLARY THYROID CARCINOMA, FOLLICULAR VARIANT, 4 CM. - TUMOR CONFINED WITHIN THYROID CAPSULE. - MARGINS NOT INVOLVED. - SEE ONCOLOGY TABLE.  THYROID GLAND: Procedure: Thyroidectomy. Tumor Focality: Unifocal. Tumor Site: Right lobe. Tumor Size: 4 x 3.2 x 2.4 cm. Histologic Type: Follicular variant of the papillary thyroid carcinoma. Margins: Free of tumor. Angioinvasion: No. Lymphatic Invasion: No. Extrathyroidal: No. Regional Lymph Nodes: No lymph nodes submitted or found. Pathologic Stage Classification (pTNM, AJCC 8th Edition): pT2,  pNx Representative tumor block: 1D. Comment(s): The nodule in the right lobe has scattered foci with nuclear cytologic features consistent with papillary thyroid carcinoma, follicular variant. There is a 6.2 cm nodule in the left lobe which has follicular architecture and scattered foci of cytologic atypia and is consistent with a follicular neoplasm without capsular or vascular invasion. There is a smaller 0.9 cm nodule in the isthmus with features of a colloid nodule. (JDP:ah 11/11/17)  Neck U/S (12/21/2018): Post total thyroidectomy without evidence of residual or locally recurrent disease.  Neck U/S (12/11/2022): Isthmus: Surgically absent. There is no residual nodular soft tissue within the isthmic resection bed.   Right lobe: Surgically absent. There is no residual nodular soft tissue within the right lobectomy resection bed.   Left lobe: Superior to the left thyroidectomy resection bed is a 0.7 x 0.7 cm hypoechoic nodule which contains internal calcifications (image 18). _________________________________________________________   No regional cervical lymphadenopathy.   There is echogenic shadowing atherosclerotic plaque within the left carotid bulb.   IMPRESSION: Superior to the left thyroidectomy resection bed is a 0.7 cm hypoechoic nodule which contains internal calcifications, nonspecific though worrisome for residual or recurrent thyroid parenchyma given provided history of elevated thyroglobulin levels. As this tissue is ill-defined on the present examination, further evaluation with contrast-enhanced neck CT and/or nuclear medicine thyroid uptake and scan could be performed as indicated.  She has a 0.7 cm nodule in the left thyroidectomy site.  Will go ahead with whole-body scan.  If this is positive, we will perform RAI treatment.  Will check with the patient first.  WBS (12/30/2022): There are 2 foci of uptake within the thyroid bed. One focus midline  and one just  LEFT of midline. No abnormal radiotracer activity identified outside of the thyroid bed. Physiologic activity noted in the GI GU tract.   IMPRESSION: Two foci of radiotracer activity within the thyroid bed. Differential includes remnant thyroid tissue versus recurrent thyroid carcinoma.  Msg sent: Dear [name], The whole-body scan results are back and they show that there is increased uptake in the thyroid bed.  While this could be just remnant healthy thyroid tissue, if we look at the thyroid ultrasound, there was a small thyroid nodule in the thyroid bed which appeared to be slightly worrisome.  Therefore, for now, my suggestion would be to go ahead and do the radioactive iodine which has the capacity to inactivate any thyroid tissue, benign or malignant.  Please let me know if you agree to go ahead with this.  If so, I will order this to be done at [facility]. Sincerely, Carlus Pavlov MD  Reviewed his thyroglobulin and ATA antibodies: Lab Results  Component Value Date   THYROGLB 0.8 (L) 12/02/2022   THYROGLB 0.2 (L) 12/07/2021   THYROGLB 0.3 (L) 12/01/2020   THYROGLB 0.2 (L) 01/07/2020   THYROGLB 0.4 (L) 11/26/2019   THYROGLB 0.3 (L) 11/23/2018   THYROGLB 0.2 (L) 06/12/2018    Lab Results  Component Value Date   THGAB <1 12/02/2022   THGAB <1 12/07/2021   THGAB <1.0 12/01/2020   THGAB <1.0 01/07/2020   THGAB 2 (H) 11/26/2019   THGAB <1 11/23/2018   THGAB <1 06/12/2018    Pt denies: - feeling nodules in neck - hoarseness - dysphagia - choking  Pt is on levothyroxine 88 alternating with 75 mcg every other day -dose changed 01/2022  - in am - fasting - at the same time: coffee (black) - added Creatine in coffee - 05/2021 and now also protein powder in her second cup - at least 30 min from b'fast - no calcium - no iron - no multivitamins - no PPIs Off vitamin C and Zinc.   Reviewed her TFTs: Lab Results  Component Value Date   TSH 5.77 (H)  01/06/2023   TSH 7.12 (H) 12/02/2022   TSH 1.02 03/20/2022   TSH 9.98 (H) 02/04/2022   TSH 0.07 (L) 12/07/2021   TSH 0.70 02/16/2021   TSH 0.18 (L) 01/05/2021   TSH 0.09 (L) 12/01/2020   TSH 0.51 01/07/2020   TSH 2.03 11/26/2019   FREET4 1.5 01/06/2023   FREET4 0.84 12/02/2022   FREET4 1.10 03/20/2022   FREET4 0.98 02/04/2022   FREET4 1.53 12/07/2021   FREET4 1.15 02/16/2021   FREET4 1.38 01/05/2021   FREET4 1.35 12/01/2020   FREET4 1.25 01/07/2020   FREET4 1.23 11/26/2019  03/26/2016: TSH 1.62, free T4 1.19, free T3 3.53  03/20/2015: TSH 0.84, free T4 0.83, free T3 2.9  + FH of thyroid ds in mother, who had to have her thyroid removed (not cancer).  + FH of thyroid cancer in sister. No h/o radiation tx to head or neck. No herbal supplements. No Biotin use. No recent steroids use.   After the surgery, she recovered and restarted running - running 7 mi 4 days a week on ave. She had hair loss, which resolved after sx.  She is from [facility].  ROS: + see HPI  I reviewed pt's medications, allergies, PMH, social hx, family hx, and changes were documented in the history of present illness. Otherwise, unchanged from my initial visit note.  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   Chronic low back pain 11/08/2014   Perimenopausal disorder 11/08/2014   Nontoxic multinodular goiter 11/08/2014   Past Surgical History:  Procedure Laterality Date   COLONOSCOPY     THYROIDECTOMY N/A 11/10/2017   Procedure:  TOTAL THYROIDECTOMY;  Surgeon: Darnell Level, MD;  Location: [facility] OR;  Service: General;  Laterality: N/A;   VARICOSE VEIN SURGERY Bilateral    WISDOM TOOTH EXTRACTION       Social History   Social History   Marital status: Married    Spouse name: N/A   Number of children: 0   Occupational History   n/a   Social History Main Topics   Smoking status: Never Smoker   Smokeless tobacco: Never Used   Alcohol use occasional   Drug use: no   Current Outpatient Medications  Medication Sig Dispense Refill   ibuprofen (ADVIL,MOTRIN) 200 MG tablet Take 400 mg by mouth every 6 (six) hours as needed for headache, mild pain or moderate pain.      levothyroxine (SYNTHROID) 88 MCG tablet Take 1 tablet (88 mcg total) by mouth daily before breakfast. 90 tablet 3   OVER THE COUNTER MEDICATION Take 1 tablet by mouth daily. Nurturlax with Aloe Supplement     No current facility-administered medications for this visit.   No Known Allergies   Family History  Problem Relation Age of Onset   Hypertension Mother    Thyroid disease Mother    Diabetes Father    Renal Disease Father    Thyroid disease Sister    Hypertension Maternal Uncle    Heart disease Maternal Uncle     PE: BP 130/82   Pulse 88   Ht 5\' 6"  (1.676 m)   Wt 134 lb 6.4 oz (61 kg)   SpO2 99%   BMI 21.69 kg/m  Wt Readings from Last 3 Encounters:  01/07/23 134 lb 6.4 oz (61 kg)  12/02/22 138 lb 6.4 oz (62.8 kg)  12/07/21 128 lb (58.1 kg)   Constitutional: normal weight, in NAD Eyes: EOMI, no exophthalmos ENT: no neck masses palpated, surgical scar healed, no cervical lymphadenopathy Cardiovascular: RRR, No MRG Respiratory: CTA B Musculoskeletal: no deformities Skin: moist, warm, no rashes Neurological: no tremor with outstretched hands  ASSESSMENT: 1.  Papillary thyroid cancer, follicular variant -Family history of thyroid cancer in her sister  2.  Postsurgical hypothyroidism  PLAN: Follicular variant of  PTC -Patient with history of nontoxic goiter, with 3 dominant nodules: The isthmic nodule was biopsied with inconclusive results, but the Afirma molecular marker was suspicious.  She had total thyroidectomy in 10/2017 by Dr. Gerrit Friends.  Final diagnosis was follicular variant of papillary thyroid cancer, measuring 4 cm (pathologic stage II TNM).  The cancer was confined to the thyroid capsule and the margins were not involved.  Also, she did not have lymphovascular invasion.  Due to the young age at diagnosis, she was categorized as stage I TNM clinically, comfort and the good prognosis.  Therefore, we did not proceed with remnant ablation -We checked a  neck ultrasound 11/2018, showing no abnormal masses -Thyroglobulin was slightly increased in 2022, and 0.3 while thyroglobulin antibodies are undetectable.  However, at last visit, thyroglobulin was higher, at 0.8 so we went ahead and checked another thyroid ultrasound.  This showed a left thyroid mass, possibly recurrence versus remnant thyroid tissue -We then checked a thyroid uptake and scan and this showed uptake at the above site and also midline in the thyroid. -Therefore, I suggested to go ahead with radioactive iodine treatment.  At today's visit, we discussed about why this is needed, expected outcomes, possible side effects (dry mouth and not much more at this low I131 dose), and we also discussed about radioactive precautions after the treatment.  Her husband will be out of town at the end of next month for 3 days and she plans to have it done then.  We did discuss about the need for a whole-body scan 7 to 10 days after the treatment. -Will plan to check another thyroglobulin level 1.5 months from now, along with annual TSH (see below) -I will then see her towards the end of the year, but possibly sooner depending on the results.  2.  Postsurgical hypothyroidism - latest thyroid labs reviewed with pt. >> TSH was elevated: Lab Results  Component  Value Date   TSH 5.77 (H) 01/06/2023  - At last visit she was on  LT4 75 alternating with 88 mcg every other day.  She would prefer not to alternate doses if possible.  We increased the dose to 88 mcg daily, and TSH decrease slightly, but was still above target (see above) - at today's visit, I suggested to increase the dose further, to 100 mcg daily and recheck the test in 1.5 months. - we discussed about taking the thyroid hormone every day, with water, >30 minutes before breakfast, separated by >4 hours from acid reflux medications, calcium, iron, multivitamins. Pt. is taking it correctly. - will check thyroid tests in 1.5 mo: TSH and fT4 - If labs are abnormal, she will need to return for repeat TFTs in 1.5 months  Orders Placed This Encounter  Procedures   NM Whole Body I131 Scan W/Thyrogen   NM Whole Body I131 Scan S/P Ca Rx   TSH   T4, free   Thyroglobulin antibody   Thyroglobulin Level   Carlus Pavlov, MD PhD [facility]

## 2023-01-07 NOTE — Telephone Encounter (Signed)
 Patient is coming in for an appt.

## 2023-01-07 NOTE — Telephone Encounter (Signed)
I called and spoke with the patient and she wanted to talk with Dr. Elvera Lennox over the phone about some questions she had and about her Levothyroxine dosage. I spoke with Dr. Elvera Lennox and she said she would see the patient today.

## 2023-01-07 NOTE — Patient Instructions (Addendum)
Please increase: - Levothyroxine 100 mcg daily  Take the thyroid hormone every day, with water, at least 30 minutes before breakfast, separated by at least 4 hours from: - acid reflux medications - calcium - iron - multivitamins  Please come back for labs in 1.5 months.  Please return in 1 year.

## 2023-01-08 ENCOUNTER — Encounter: Payer: Self-pay | Admitting: Internal Medicine

## 2023-01-10 NOTE — Written Directive (Cosign Needed)
  I-131 THYROID CANCER THERAPY   RADIOPHARMACEUTICAL:  Iodine-131 Capsule    PRESCRIBED DOSE FOR ADMINISTRATION: 100 mCI   ROUTE OFADMINISTRATION:  PO   DIAGNOSIS: Papillary thyroid carcinoma /follicular variant    REFERRING PHYSICIAN: Carlus Pavlov, MD    THYROGEN STIMULATION OR HORMONE WITHDRAW: Thyrogen Stimulating   REMNANT ABLATION OR ADJUVANT THERAPY:Remanant ablation and adjuvent Therapy   DATE OF THYROIDECTOMY: 11/10/17   SURGEON:   TSH:   Lab Results  Component Value Date   TSH 5.77 (H) 01/06/2023   TSH 7.12 (H) 12/02/2022   TSH 1.02 03/20/2022     PRIOR I-131 THERAPY (Date and Dose):   Pathology:  Cell type: [x]   Papillary  []   Follicular  []   [place]   Largest tumor focus:4.0      cm  Extrathyroidal Extension?     Yes []   No [x]     Lymphovascular Invasion?  Yes  []   No  [x]     Margins positive ? Yes []   No [x]     Lymph nodes positive? Yes []   No  []       # positive nodes:  0 # negative nodes:  0   TNM staging: pT:   T2      PN:   X      Mx:    ADDITIONAL PHYSICIAN COMMENTS/NOTES Uptake in thyroid bed on I 131 diagnostic scan.  Thyroid US nodule.  Potential PTC recurrence. 100 mCi dose.   AUTHORIZED USER SIGNATURE & TIME STAMP: Patriciaann Clan, MD   01/13/23    10:29 AM

## 2023-01-27 ENCOUNTER — Encounter (HOSPITAL_COMMUNITY): Payer: PRIVATE HEALTH INSURANCE

## 2023-01-28 ENCOUNTER — Other Ambulatory Visit (HOSPITAL_COMMUNITY): Payer: PRIVATE HEALTH INSURANCE

## 2023-01-29 ENCOUNTER — Other Ambulatory Visit (HOSPITAL_COMMUNITY): Payer: PRIVATE HEALTH INSURANCE

## 2023-02-03 ENCOUNTER — Encounter (HOSPITAL_COMMUNITY)
Admission: RE | Admit: 2023-02-03 | Discharge: 2023-02-03 | Disposition: A | Discharge status: Home / Self Care | Payer: 59 | Source: Ambulatory Visit | Attending: Internal Medicine | Admitting: Internal Medicine

## 2023-02-03 DIAGNOSIS — C73 Malignant neoplasm of thyroid gland: Secondary | ICD-10-CM | POA: Diagnosis present

## 2023-02-03 MED ORDER — THYROTROPIN ALFA 0.9 MG IM SOLR
INTRAMUSCULAR | Status: AC
  Filled 2023-02-03: qty 0.9

## 2023-02-03 MED ORDER — THYROTROPIN ALFA 0.9 MG IM SOLR
0.9000 mg | INTRAMUSCULAR | Status: AC
  Administered 2023-02-03: 0.9 mg via INTRAMUSCULAR

## 2023-02-04 ENCOUNTER — Telehealth: Payer: Self-pay

## 2023-02-04 ENCOUNTER — Encounter (HOSPITAL_COMMUNITY)
Admission: RE | Admit: 2023-02-04 | Discharge: 2023-02-04 | Disposition: A | Discharge status: Home / Self Care | Payer: 59 | Source: Ambulatory Visit | Attending: Internal Medicine | Admitting: Internal Medicine

## 2023-02-04 DIAGNOSIS — C73 Malignant neoplasm of thyroid gland: Secondary | ICD-10-CM | POA: Diagnosis not present

## 2023-02-04 MED ORDER — THYROTROPIN ALFA 0.9 MG IM SOLR
0.9000 mg | INTRAMUSCULAR | Status: AC
  Administered 2023-02-04: 0.9 mg via INTRAMUSCULAR

## 2023-02-04 MED ORDER — THYROTROPIN ALFA 0.9 MG IM SOLR
INTRAMUSCULAR | Status: AC
  Filled 2023-02-04: qty 0.9

## 2023-02-04 NOTE — Telephone Encounter (Signed)
 Ok to skip the tablet that day.

## 2023-02-04 NOTE — Telephone Encounter (Signed)
 Received a message from Madolyn Frieze, stating that this patient would like to know if she needs to go on a Low Iodine Diet for the RAI Treatment? Please Advise

## 2023-02-04 NOTE — Telephone Encounter (Signed)
 J, As I discussed with the patient at the time of her visit and afterwards, I would really follow the nuclear medicine department protocol.  I am not sure who Glenys Cleveland is but I am assuming she is from [place].  They really need to have a protocol in place for this.  Otherwise, I did discuss with the patient to be on the low iodine diet for few days.

## 2023-02-04 NOTE — Telephone Encounter (Signed)
 Pt has been notified and  everything is okay now. But while on the phone with the patient she states that she is supposed to be NPO the day of the pill. she is supposed to take the pill at 9am and she wants to know does she need to take her Thyroid  medication since they told her NPO 6 hours prior.

## 2023-02-04 NOTE — Telephone Encounter (Signed)
 Pt has been notified and voices understanding.

## 2023-02-05 ENCOUNTER — Encounter (HOSPITAL_COMMUNITY)
Admission: RE | Admit: 2023-02-05 | Discharge: 2023-02-05 | Disposition: A | Discharge status: Home / Self Care | Payer: 59 | Source: Ambulatory Visit | Attending: Internal Medicine | Admitting: Internal Medicine

## 2023-02-05 MED ORDER — SODIUM IODIDE I 131 CAPSULE: 96.7000 | Freq: Once | INTRAVENOUS | Status: DC

## 2023-02-13 ENCOUNTER — Encounter (HOSPITAL_COMMUNITY): Payer: PRIVATE HEALTH INSURANCE

## 2023-02-13 ENCOUNTER — Ambulatory Visit (HOSPITAL_COMMUNITY)
Admission: RE | Admit: 2023-02-13 | Discharge: 2023-02-13 | Disposition: A | Discharge status: Home / Self Care | Payer: PRIVATE HEALTH INSURANCE | Source: Ambulatory Visit | Attending: Internal Medicine | Admitting: Internal Medicine

## 2023-02-13 DIAGNOSIS — C73 Malignant neoplasm of thyroid gland: Secondary | ICD-10-CM | POA: Insufficient documentation

## 2023-02-17 ENCOUNTER — Other Ambulatory Visit: Payer: Self-pay

## 2023-02-18 ENCOUNTER — Encounter: Payer: Self-pay | Admitting: Internal Medicine

## 2023-02-18 ENCOUNTER — Other Ambulatory Visit: Payer: PRIVATE HEALTH INSURANCE

## 2023-02-18 ENCOUNTER — Telehealth: Payer: Self-pay | Admitting: Internal Medicine

## 2023-02-18 NOTE — Telephone Encounter (Signed)
I have called the Provider Line and asked them to read her scan from 02/13/23

## 2023-02-18 NOTE — Telephone Encounter (Signed)
Patient wanted to know if the results for her Body Scan have come back yet and if so could she be called [phone]

## 2023-02-19 ENCOUNTER — Encounter: Payer: Self-pay | Admitting: Internal Medicine

## 2023-02-20 LAB — THYROGLOBULIN LEVEL: Thyroglobulin: 39.8 ng/mL

## 2023-02-20 LAB — T4, FREE: Free T4: 1.6 ng/dL (ref 0.8–1.8)

## 2023-02-20 LAB — TSH: TSH: 0.93 m[IU]/L (ref 0.40–4.50)

## 2023-02-20 LAB — THYROGLOBULIN ANTIBODY: Thyroglobulin Ab: <1 [IU]/mL (ref <=1)

## 2023-03-28 ENCOUNTER — Telehealth: Payer: Self-pay

## 2023-03-28 DIAGNOSIS — E890 Postprocedural hypothyroidism: Secondary | ICD-10-CM

## 2023-03-28 MED ORDER — LEVOTHYROXINE SODIUM 100 MCG PO TABS: 100.0000 ug | ORAL_TABLET | Freq: Every day | ORAL | 1 refills | Status: DC

## 2023-03-28 NOTE — Telephone Encounter (Signed)
 Requested Prescriptions   Signed Prescriptions Disp Refills   levothyroxine (SYNTHROID) 100 MCG tablet 90 tablet 1    Sig: Take 1 tablet (100 mcg total) by mouth daily before breakfast.    Authorizing Provider: Carlus Pavlov    Ordering User: Pollie Meyer

## 2023-05-02 ENCOUNTER — Other Ambulatory Visit: Payer: PRIVATE HEALTH INSURANCE

## 2023-05-02 ENCOUNTER — Telehealth: Payer: Self-pay

## 2023-05-02 ENCOUNTER — Telehealth: Payer: Self-pay | Admitting: Internal Medicine

## 2023-05-02 ENCOUNTER — Other Ambulatory Visit: Payer: Self-pay

## 2023-05-02 DIAGNOSIS — E890 Postprocedural hypothyroidism: Secondary | ICD-10-CM

## 2023-05-02 NOTE — Telephone Encounter (Signed)
 MEDICATION: levothyroxine   PHARMACY:    [id number] - [place], [place] - [address] AT [facility] [address] (Ph: [phone])    HAS THE PATIENT CONTACTED THEIR PHARMACY?  No  IS THIS A 90 DAY SUPPLY : Yes     IS PATIENT OUT OF MEDICATION: No  IF NOT; HOW MUCH IS LEFT: 2 pills  LAST APPOINTMENT DATE: @12 /17/2024  NEXT APPOINTMENT DATE:@11 /11/2023  DO WE HAVE YOUR PERMISSION TO LEAVE A DETAILED MESSAGE?:Yes  OTHER COMMENTS: Patient would like to wait for lab results to see if dosage changes before refilling, but does only have 2 pills left    **Let patient know to contact pharmacy at the end of the day to make sure medication is ready. **  ** Please notify patient to allow 48-72 hours to process**  **Encourage patient to contact the pharmacy for refills or they can request refills through [facility]**

## 2023-05-02 NOTE — Telephone Encounter (Signed)
 Orders Placed This Encounter  Procedures   TSH   T4, free

## 2023-05-03 LAB — TSH: TSH: 0.88 m[IU]/L (ref 0.40–4.50)

## 2023-05-03 LAB — T4, FREE: Free T4: 1.6 ng/dL (ref 0.8–1.8)

## 2023-05-05 ENCOUNTER — Encounter: Payer: Self-pay | Admitting: Internal Medicine

## 2023-09-29 ENCOUNTER — Telehealth: Payer: Self-pay | Admitting: Internal Medicine

## 2023-09-29 DIAGNOSIS — E890 Postprocedural hypothyroidism: Secondary | ICD-10-CM

## 2023-09-29 MED ORDER — LEVOTHYROXINE SODIUM 100 MCG PO TABS: 100.0000 ug | ORAL_TABLET | Freq: Every day | ORAL | 2 refills | Status: DC

## 2023-09-29 NOTE — Telephone Encounter (Signed)
 Patient has been travelling and left her Levothyroxine  100 MCG in [place] .    She has not had medication for 2 days.   Patient requesting refill to be sent to: [facility] #[id number] [place], [place]  Call back # [phone]

## 2023-12-02 ENCOUNTER — Ambulatory Visit: Payer: PRIVATE HEALTH INSURANCE | Admitting: Internal Medicine

## 2023-12-02 ENCOUNTER — Other Ambulatory Visit: Payer: PRIVATE HEALTH INSURANCE

## 2023-12-02 ENCOUNTER — Encounter: Payer: Self-pay | Admitting: Internal Medicine

## 2023-12-02 VITALS — BP 110/60 | HR 70 | Resp 18 | Ht 65.75 in | Wt 136.8 lb

## 2023-12-02 DIAGNOSIS — E890 Postprocedural hypothyroidism: Secondary | ICD-10-CM

## 2023-12-02 DIAGNOSIS — C73 Malignant neoplasm of thyroid gland: Secondary | ICD-10-CM | POA: Diagnosis not present

## 2023-12-02 MED ORDER — LEVOTHYROXINE SODIUM 100 MCG PO TABS: 100.0000 ug | ORAL_TABLET | Freq: Every day | ORAL | Status: DC

## 2023-12-02 NOTE — Patient Instructions (Addendum)
 Please continue: - Levothyroxine  100 mcg daily  Take the thyroid  hormone every day, with water , at least 30 minutes before breakfast, separated by at least 4 hours from: - acid reflux medications - calcium  - iron - multivitamins  Please come back for labs in 1.5 months.  Please return in 1 year.

## 2023-12-02 NOTE — Progress Notes (Signed)
 Patient ID: Jeanne Roberts, female   DOB: 01-15-1966, 58 y.o.   MRN: [id number]   HPI  Jeanne Roberts is a 58 y.o.-year-old female, returning for follow-up for thyroid  cancer and post surgical hypothyroidism. Last visit with me  PCP: Dr Andrew  Interim hx: She denies swallowing, choking, neck pressure. Before last visit, she had increased pelvic pain and had an ultrasound that showed a thickened endometrium. She had an endometrial biopsy attempted in Dr. Idelle office yesterday but she had a syncopal episode during the procedure.  She had a D&C and the results were benign. At today's visit, she is feeling well, without complaints except for hair loss.  She also feels that she is losing her curls.  Reviewed her thyroid  cancer history:  She was dx'ed with thyroid  nodules in 2005, after a thyroid  nodule was found by palpation.  ... See previous notes: she has thyroid  ultrasound and biopsies over the years, latest biopsy with positive Afirma   Total thyroidectomy 11/10/2017:  Diagnosis Thyroid , thyroidectomy - PAPILLARY THYROID  CARCINOMA, FOLLICULAR VARIANT, 4 CM. - TUMOR CONFINED WITHIN THYROID  CAPSULE. - MARGINS NOT INVOLVED. - SEE ONCOLOGY TABLE.  THYROID  GLAND: Procedure: Thyroidectomy. Tumor Focality: Unifocal. Tumor Site: Right lobe. Tumor Size: 4 x 3.2 x 2.4 cm. Histologic Type: Follicular variant of the papillary thyroid  carcinoma. Margins: Free of tumor. Angioinvasion: No. Lymphatic Invasion: No. Extrathyroidal: No. Regional Lymph Nodes: No lymph nodes submitted or found. Pathologic Stage Classification (pTNM, AJCC 8th Edition): pT2, pNx Representative tumor block: 1D. Comment(s): The nodule in the right lobe has scattered foci with nuclear cytologic features consistent with papillary thyroid  carcinoma, follicular variant. There is a 6.2 cm nodule in the left lobe which has follicular architecture and scattered foci of cytologic atypia and is consistent with a follicular  neoplasm without capsular or vascular invasion. There is a smaller 0.9 cm nodule in the isthmus with features of a colloid nodule. (JDP:ah 11/11/17)  Neck U/S (12/21/2018): Post total thyroidectomy without evidence of residual or locally recurrent disease.  Neck U/S (12/11/2022): Isthmus: Surgically absent. There is no residual nodular soft tissue within the isthmic resection bed.   Right lobe: Surgically absent. There is no residual nodular soft tissue within the right lobectomy resection bed.   Left lobe: Superior to the left thyroidectomy resection bed is a 0.7 x 0.7 cm hypoechoic nodule which contains internal calcifications (image 18). _________________________________________________________   No regional cervical lymphadenopathy.   There is echogenic shadowing atherosclerotic plaque within the left carotid bulb.   IMPRESSION: Superior to the left thyroidectomy resection bed is a 0.7 cm hypoechoic nodule which contains internal calcifications, nonspecific though worrisome for residual or recurrent thyroid  parenchyma given provided history of elevated thyroglobulin levels. As this tissue is ill-defined on the present examination, further evaluation with contrast-enhanced neck CT and/or nuclear medicine thyroid  uptake and scan could be performed as indicated.  She has a 0.7 cm nodule in the left thyroidectomy site.  Will go ahead with whole-body scan.  If this is positive, we will perform RAI treatment.  Will check with the patient first.  Thyrogen  stimulated WBS (12/30/2022): There are 2 foci of uptake within the thyroid  bed. One focus midline and one just LEFT of midline. No abnormal radiotracer activity identified outside of the thyroid  bed. Physiologic activity noted in the GI GU tract.   IMPRESSION: Two foci of radiotracer activity within the thyroid  bed. Differential includes remnant thyroid  tissue versus recurrent thyroid  carcinoma.  Msg sent: Dear Ms.  Roberts, The whole-body scan  results are back and they show that there is increased uptake in the thyroid  bed.  While this could be just remnant healthy thyroid  tissue, if we look at the thyroid  ultrasound, there was a small thyroid  nodule in the thyroid  bed which appeared to be slightly worrisome.  Therefore, for now, my suggestion would be to go ahead and do the radioactive iodine which has the capacity to inactivate any thyroid  tissue, benign or malignant.  Please let me know if you agree to go ahead with this.  If so, I will order this to be done at [facility]. Sincerely, Lela Fendt MD   RAI Tx (02/05/2023): I-131 96.7 mCi   Posttreatment WBS (02/18/2023):  1. Two foci of radiotracer activity within the thyroid  bed matching pre therapy diagnostic scan. 2. No evidence of metastatic disease outside the thyroid  bed     Msg sent: Dear Jeanne Roberts Richard news: The posttreatment whole-body scan did not show any signs of metastasis.  The only uptake was at the level of the thyroid  bed, which was expected!  I am hoping that your thyroglobulin will gradually improve now and hopefully become undetectable.  Let's plan to repeat the levels approximately 2-3 months after the radioactive iodine treatment.  The labs are in. Please call our main office number [phone]) to schedule a lab appointment.  Sincerely, Lela Fendt MD  Reviewed his thyroglobulin and ATA antibodies: Lab Results  Component Value Date   THYROGLB 39.8 02/18/2023   THYROGLB 3.3 01/06/2023   THYROGLB 0.8 (L) 12/02/2022   THYROGLB 0.2 (L) 12/07/2021   THYROGLB 0.3 (L) 12/01/2020   THYROGLB 0.2 (L) 01/07/2020   THYROGLB 0.4 (L) 11/26/2019   THYROGLB 0.3 (L) 11/23/2018   THYROGLB 0.2 (L) 06/12/2018    Lab Results  Component Value Date   THGAB <1 02/18/2023   THGAB <1 01/06/2023   THGAB <1 12/02/2022   THGAB <1 12/07/2021   THGAB <1.0 12/01/2020   THGAB <1.0 01/07/2020   THGAB 2 (H) 11/26/2019   THGAB  <1 11/23/2018   THGAB <1 06/12/2018    Pt denies: - feeling nodules in neck - hoarseness - dysphagia - choking  Pt is on levothyroxine  100 mcg daily: - in am - fasting - at the same time: coffee (black) + creatine - at least 30 min from b'fast - no calcium  - no iron - no multivitamins - no PPIs  Reviewed her TFTs: Lab Results  Component Value Date   TSH 0.88 05/02/2023   TSH 0.93 02/18/2023   TSH 5.77 (H) 01/06/2023   TSH 7.12 (H) 12/02/2022   TSH 1.02 03/20/2022   TSH 9.98 (H) 02/04/2022   TSH 0.07 (L) 12/07/2021   TSH 0.70 02/16/2021   TSH 0.18 (L) 01/05/2021   TSH 0.09 (L) 12/01/2020   FREET4 1.6 05/02/2023   FREET4 1.6 02/18/2023   FREET4 1.5 01/06/2023   FREET4 0.84 12/02/2022   FREET4 1.10 03/20/2022   FREET4 0.98 02/04/2022   FREET4 1.53 12/07/2021   FREET4 1.15 02/16/2021   FREET4 1.38 01/05/2021   FREET4 1.35 12/01/2020  03/26/2016: TSH 1.62, free T4 1.19, free T3 3.53  03/20/2015: TSH 0.84, free T4 0.83, free T3 2.9  + FH of thyroid  ds in mother, who had to have her thyroid  removed (not cancer).  + FH of thyroid  cancer in sister. No h/o radiation tx to head or neck. No herbal supplements. No Biotin use. No recent steroids use.   After the surgery, she recovered and  restarted running - running 7 mi 4 days a week on ave. She had hair loss, which resolved after sx.  She is from [place] .  ROS: + see HPI  I reviewed pt's medications, allergies, PMH, social hx, family hx, and changes were documented in the history of present illness. Otherwise, unchanged from my initial visit note.  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   Chronic low back pain 11/08/2014   Perimenopausal disorder 11/08/2014   Nontoxic multinodular goiter 11/08/2014   Past Surgical History:  Procedure Laterality Date   COLONOSCOPY     THYROIDECTOMY N/A 11/10/2017   Procedure: TOTAL THYROIDECTOMY;  Surgeon: Eletha Boas, MD;  Location: [facility] OR;  Service: General;  Laterality: N/A;   VARICOSE VEIN SURGERY Bilateral    WISDOM TOOTH EXTRACTION       Social History   Social History   Marital status: Married    Spouse name: N/A   Number of children: 0   Occupational History   n/a   Social History Main Topics   Smoking status: Never Smoker   Smokeless tobacco: Never Used   Alcohol use occasional   Drug use: no   Current Outpatient Medications  Medication Sig Dispense Refill   ibuprofen (ADVIL,MOTRIN) 200 MG tablet Take 400 mg by mouth every 6 (six) hours as needed for headache, mild pain or moderate pain.      levothyroxine  (SYNTHROID ) 100 MCG tablet Take 1 tablet (100 mcg total) by mouth daily before breakfast. 30 tablet 2   OVER THE COUNTER MEDICATION Take 1 tablet by mouth daily. Nurturlax with Aloe Supplement     No current facility-administered medications for this visit.   No Known Allergies   Family History  Problem Relation Age of Onset   Hypertension Mother    Thyroid  disease Mother    Diabetes Father    Renal Disease Father    Thyroid  disease Sister    Hypertension Maternal Uncle    Heart disease Maternal Uncle     PE: BP 110/60   Pulse 70   Resp 18   Ht 5' 5.75 (1.67 m)   Wt 136 lb 12.8 oz (62.1 kg)   SpO2 97%   BMI 22.25 kg/m  Wt Readings from Last 3 Encounters:  12/02/23 136 lb 12.8 oz (62.1 kg)  01/07/23 134 lb 6.4 oz (61 kg)  12/02/22 138 lb 6.4 oz (62.8 kg)   Constitutional: normal weight, in NAD Eyes: EOMI, no exophthalmos ENT: no neck masses  palpated, surgical scar healed, no cervical lymphadenopathy Cardiovascular: RRR, No MRG Respiratory: CTA B Musculoskeletal: no deformities Skin: no rashes Neurological: no tremor with outstretched hands  ASSESSMENT: 1.  Papillary thyroid  cancer, follicular variant -Family history of thyroid  cancer in her sister  2.  Postsurgical hypothyroidism  PLAN: Follicular variant of PTC - Patient with history of nontoxic goiter, with 3 dominant nodules of which one biopsied with inconclusive results with a suspicious Afirma molecular marker.  She had total thyroidectomy in 10/2017 by Dr. Eletha.  Final diagnosis was follicular variant  of papillary thyroid  cancer, measuring 4 cm.  The cancer was confined to the thyroid  capsule and the margins were not involved.  Also, she did not have lymphovascular invasion.  Due to the young age at diagnosis, she was categorized as clinically stage I TNM, with good prognosis.  Therefore, we did not proceed with remnant ablation at that time.   - A neck ultrasound from 11/2018 showed no abnormal masses.  Thyroglobulin was slightly increased in 2022, at 0.3, while the thyroglobulin antibodies were undetectable.  However, her thyroglobulin level increased afterwards to 0.8 so we went ahead and checked another thyroid  ultrasound.  This showed a left thyroid  mass, possibly remnant thyroid  tissue.  We then checked a thyroid  uptake and scan and this showed uptake at the level of the above site and midline in the thyroid .  Therefore, I suggested to go ahead with radioactive iodine treatment.  She had this since last visit (with 96.7 mCi) and a post treatment whole-body scan showed only uptake in the thyroid  remnant. - Around the time of her Thyrogen  stimulation, she had thyroglobulin level checked and this was elevated, as expected in this setting - At today's visit we will check another thyroglobulin level along with Tg antibodies - I will see her back in a year  2.   Postsurgical hypothyroidism - latest thyroid  labs reviewed with pt. >> normal: Lab Results  Component Value Date   TSH 0.88 05/02/2023  - she continues on LT4 88 mcg daily, dose increased at last visit - pt feels good on this dose.  However, she complains of hair loss and losing the curly structure of her hair.  We did discuss about the possibility of starting a multivitamin +/- a Hair Skin and Nails supplement.  We discussed that she needs to hold biotin for 2 to 7 days before hormonal labs in case she decides to start this. - we discussed about taking the thyroid  hormone every day, with water , >30 minutes before breakfast, separated by >4 hours from acid reflux medications, calcium , iron, multivitamins. Pt. is taking it correctly. - will check thyroid  tests today: TSH and fT4 - If labs are abnormal, she will need to return for repeat TFTs in 1.5 months  Orders Placed This Encounter  Procedures   TSH   T4, free   Thyroglobulin Level   Thyroglobulin antibody   She needs refills.  Lela Fendt, MD PhD [facility] Endocrinology

## 2023-12-04 ENCOUNTER — Ambulatory Visit: Payer: Self-pay | Admitting: Internal Medicine

## 2023-12-04 ENCOUNTER — Encounter: Payer: Self-pay | Admitting: Internal Medicine

## 2023-12-04 LAB — T4, FREE: Free T4: 1.7 ng/dL (ref 0.8–1.8)

## 2023-12-04 LAB — THYROGLOBULIN LEVEL: Thyroglobulin: 0.1 ng/mL — ABNORMAL LOW

## 2023-12-04 LAB — TSH: TSH: 0.3 m[IU]/L — ABNORMAL LOW (ref 0.40–4.50)

## 2023-12-04 LAB — THYROGLOBULIN ANTIBODY: Thyroglobulin Ab: <1 [IU]/mL (ref <=1)

## 2023-12-04 MED ORDER — LEVOTHYROXINE SODIUM 100 MCG PO TABS: 100.0000 ug | ORAL_TABLET | Freq: Every day | ORAL | 3 refills | Status: AC

## 2023-12-04 NOTE — Addendum Note (Signed)
 Addended by: TRIXIE FILE on: 12/04/2023 12:12 PM   Modules accepted: Orders

## 2024-02-02 ENCOUNTER — Other Ambulatory Visit: Payer: PRIVATE HEALTH INSURANCE

## 2024-02-03 LAB — T4, FREE: Free T4: 1.8 ng/dL (ref 0.8–1.8)

## 2024-02-03 LAB — TSH: TSH: 0.44 m[IU]/L (ref 0.40–4.50)

## 2024-11-29 ENCOUNTER — Ambulatory Visit: Payer: PRIVATE HEALTH INSURANCE | Admitting: Internal Medicine
